# Patient Record
Sex: Female | Born: 1960 | Race: White | Hispanic: No | Marital: Married | Smoking: Never smoker
Health system: Southern US, Community
[De-identification: ages and names within clinical notes are randomized; demographics above are authoritative.]

## PROBLEM LIST (undated history)

## (undated) DIAGNOSIS — Z789 Other specified health status: Secondary | ICD-10-CM

## (undated) HISTORY — PX: TUBAL LIGATION: SHX77

---

## 2010-01-01 ENCOUNTER — Other Ambulatory Visit: Admission: RE | Admit: 2010-01-01 | Discharge: 2010-01-01 | Payer: Self-pay | Admitting: Gynecology

## 2010-01-01 ENCOUNTER — Ambulatory Visit: Payer: Self-pay | Admitting: Women's Health

## 2012-06-30 ENCOUNTER — Encounter: Payer: Self-pay | Admitting: Women's Health

## 2012-06-30 ENCOUNTER — Ambulatory Visit (INDEPENDENT_AMBULATORY_CARE_PROVIDER_SITE_OTHER): Payer: 59 | Admitting: Women's Health

## 2012-06-30 ENCOUNTER — Other Ambulatory Visit (HOSPITAL_COMMUNITY)
Admission: RE | Admit: 2012-06-30 | Discharge: 2012-06-30 | Disposition: A | Discharge status: Home / Self Care | Payer: 59 | Source: Ambulatory Visit | Attending: Women's Health | Admitting: Women's Health

## 2012-06-30 ENCOUNTER — Other Ambulatory Visit: Payer: Self-pay | Admitting: Gynecology

## 2012-06-30 VITALS — BP 118/88 | Ht 64.0 in | Wt 142.0 lb

## 2012-06-30 DIAGNOSIS — Z1231 Encounter for screening mammogram for malignant neoplasm of breast: Secondary | ICD-10-CM

## 2012-06-30 DIAGNOSIS — N920 Excessive and frequent menstruation with regular cycle: Secondary | ICD-10-CM

## 2012-06-30 DIAGNOSIS — Z01419 Encounter for gynecological examination (general) (routine) without abnormal findings: Secondary | ICD-10-CM | POA: Insufficient documentation

## 2012-06-30 DIAGNOSIS — N898 Other specified noninflammatory disorders of vagina: Secondary | ICD-10-CM

## 2012-06-30 DIAGNOSIS — Z1151 Encounter for screening for human papillomavirus (HPV): Secondary | ICD-10-CM | POA: Insufficient documentation

## 2012-06-30 DIAGNOSIS — Z13220 Encounter for screening for lipoid disorders: Secondary | ICD-10-CM

## 2012-06-30 DIAGNOSIS — Z833 Family history of diabetes mellitus: Secondary | ICD-10-CM

## 2012-06-30 LAB — CBC WITH DIFFERENTIAL/PLATELET
HCT: 38.8 % (ref 36.0–46.0)
Hemoglobin: 14 g/dL (ref 12.0–15.0)
Lymphocytes Relative: 14 % (ref 12–46)
Lymphs Abs: 1.2 10*3/uL (ref 0.7–4.0)
MCHC: 36.1 g/dL — ABNORMAL HIGH (ref 30.0–36.0)
Monocytes Absolute: 0.7 10*3/uL (ref 0.1–1.0)
Monocytes Relative: 8 % (ref 3–12)
Neutro Abs: 6.3 10*3/uL (ref 1.7–7.7)
Neutrophils Relative %: 76 % (ref 43–77)
RBC: 4.33 MIL/uL (ref 3.87–5.11)
WBC: 8.3 10*3/uL (ref 4.0–10.5)

## 2012-06-30 LAB — LIPID PANEL
Cholesterol: 160 mg/dL (ref 0–200)
HDL: 56 mg/dL (ref >39)
Total CHOL/HDL Ratio: 2.9 Ratio
Triglycerides: 108 mg/dL (ref <150)
VLDL: 22 mg/dL (ref 0–40)

## 2012-06-30 LAB — GLUCOSE, RANDOM: Glucose, Bld: 81 mg/dL (ref 70–99)

## 2012-06-30 NOTE — Progress Notes (Signed)
Katherine Velez 04/26/1961 [id number]    History:    The patient presents for annual exam.  BTL Monthly cycles, until last month had spotting for 2 weeks and then had 8 day extremely heavy cycle. Denies menopausal symptoms. History of normal Paps, last Pap 2011 in [place]. Currently attending graduate school at [employer]. Has not had a mammogram in several years. Has had a normal one in the past. Has not had a colonoscopy.   Past medical history, past surgical history, family history and social history were all reviewed and documented in the EPIC chart. Son in college in [place],  daughter lives in [place]. Originally from [place].   ROS:  A  ROS was performed and pertinent positives and negatives are included in the history.  Exam:  Filed Vitals:   06/30/12 0954  BP: 118/88    General appearance:  Normal Head/Neck:  Normal, without cervical or supraclavicular adenopathy. Thyroid:  Symmetrical, normal in size, without palpable masses or nodularity. Respiratory  Effort:  Normal  Auscultation:  Clear without wheezing or rhonchi Cardiovascular  Auscultation:  Regular rate, without rubs, murmurs or gallops  Edema/varicosities:  Not grossly evident Abdominal  Soft,nontender, without masses, guarding or rebound.  Liver/spleen:  No organomegaly noted  Hernia:  None appreciated  Skin  Inspection:  Grossly normal  Palpation:  Grossly normal Neurologic/psychiatric  Orientation:  Normal with appropriate conversation.  Mood/affect:  Normal  Genitourinary    Breasts: Examined lying and sitting.     Right: Without masses, retractions, discharge or axillary adenopathy.     Left: Without masses, retractions, discharge or axillary adenopathy.   Inguinal/mons:  Normal without inguinal adenopathy  External genitalia:  Normal  BUS/Urethra/Skene's glands:  Normal  Bladder:  Normal  Vagina:  Normal  Cervix:  Normal  Uterus:   normal in size, shape and contour.  Midline and  mobile  Adnexa/parametria:     Rt: Without masses or tenderness.   Lt: Without masses or tenderness.  Anus and perineum: Normal  Digital rectal exam: Normal sphincter tone without palpated masses or tenderness  Assessment/Plan:  51 y.o. M WF G2 P2 for annual exam.     Two-week spotting episode followed by  8 day heavy cycle in December/BTL Limited healthcare in several years  Plan: CBC, TSH, glucose, lipid panel, UA, Pap. Reviewed importance of an annual mammogram, instructed to schedule mammogram with 3 deep tomography, breasts are days. Sonohysterogram with Dr. Lily Peer, will schedule. Colonoscopy, reviewed importance of a screening. SBE's, increase regular exercise, calcium rich diet, vitamin D 2000 daily encouraged.    [facility], 11:47 AM 06/30/2012 2

## 2012-06-30 NOTE — Patient Instructions (Addendum)
Vit D 2000 daily Colonoscopy  [place], Dr [address] Mammogram yearly Health Maintenance, Females A healthy lifestyle and preventative care can promote health and wellness.  Maintain regular health, dental, and eye exams.  Eat a healthy diet. Foods like vegetables, fruits, whole grains, low-fat dairy products, and lean protein foods contain the nutrients you need without too many calories. Decrease your intake of foods high in solid fats, added sugars, and salt. Get information about a proper diet from your caregiver, if necessary.  Regular physical exercise is one of the most important things you can do for your health. Most adults should get at least 150 minutes of moderate-intensity exercise (any activity that increases your heart rate and causes you to sweat) each week. In addition, most adults need muscle-strengthening exercises on 2 or more days a week.   Maintain a healthy weight. The body mass index (BMI) is a screening tool to identify possible weight problems. It provides an estimate of body fat based on height and weight. Your caregiver can help determine your BMI, and can help you achieve or maintain a healthy weight. For adults 20 years and older:  A BMI below 18.5 is considered underweight.  A BMI of 18.5 to 24.9 is normal.  A BMI of 25 to 29.9 is considered overweight.  A BMI of 30 and above is considered obese.  Maintain normal blood lipids and cholesterol by exercising and minimizing your intake of saturated fat. Eat a balanced diet with plenty of fruits and vegetables. Blood tests for lipids and cholesterol should begin at age 44 and be repeated every 5 years. If your lipid or cholesterol levels are high, you are over 50, or you are a high risk for heart disease, you may need your cholesterol levels checked more frequently.Ongoing high lipid and cholesterol levels should be treated with medicines if diet and exercise are not effective.  If you smoke, find out from your  caregiver how to quit. If you do not use tobacco, do not start.  If you are pregnant, do not drink alcohol. If you are breastfeeding, be very cautious about drinking alcohol. If you are not pregnant and choose to drink alcohol, do not exceed 1 drink per day. One drink is considered to be 12 ounces (355 mL) of beer, 5 ounces (148 mL) of wine, or 1.5 ounces (44 mL) of liquor.  Avoid use of street drugs. Do not share needles with anyone. Ask for help if you need support or instructions about stopping the use of drugs.  High blood pressure causes heart disease and increases the risk of stroke. Blood pressure should be checked at least every 1 to 2 years. Ongoing high blood pressure should be treated with medicines, if weight loss and exercise are not effective.  If you are 69 to 51 years old, ask your caregiver if you should take aspirin to prevent strokes.  Diabetes screening involves taking a blood sample to check your fasting blood sugar level. This should be done once every 3 years, after age 21, if you are within normal weight and without risk factors for diabetes. Testing should be considered at a younger age or be carried out more frequently if you are overweight and have at least 1 risk factor for diabetes.  Breast cancer screening is essential preventative care for women. You should practice "breast self-awareness." This means understanding the normal appearance and feel of your breasts and may include breast self-examination. Any changes detected, no matter how small, should be reported  to a caregiver. Women in their 52s and 30s should have a clinical breast exam (CBE) by a caregiver as part of a regular health exam every 1 to 3 years. After age 2, women should have a CBE every year. Starting at age 36, women should consider having a mammogram (breast X-ray) every year. Women who have a family history of breast cancer should talk to their caregiver about genetic screening. Women at a high risk of  breast cancer should talk to their caregiver about having an MRI and a mammogram every year.  The Pap test is a screening test for cervical cancer. Women should have a Pap test starting at age 58. Between ages 60 and 66, Pap tests should be repeated every 2 years. Beginning at age 30, you should have a Pap test every 3 years as long as the past 3 Pap tests have been normal. If you had a hysterectomy for a problem that was not cancer or a condition that could lead to cancer, then you no longer need Pap tests. If you are between ages 56 and 76, and you have had normal Pap tests going back 10 years, you no longer need Pap tests. If you have had past treatment for cervical cancer or a condition that could lead to cancer, you need Pap tests and screening for cancer for at least 20 years after your treatment. If Pap tests have been discontinued, risk factors (such as a new sexual partner) need to be reassessed to determine if screening should be resumed. Some women have medical problems that increase the chance of getting cervical cancer. In these cases, your caregiver may recommend more frequent screening and Pap tests.  The human papillomavirus (HPV) test is an additional test that may be used for cervical cancer screening. The HPV test looks for the virus that can cause the cell changes on the cervix. The cells collected during the Pap test can be tested for HPV. The HPV test could be used to screen women aged 59 years and older, and should be used in women of any age who have unclear Pap test results. After the age of 22, women should have HPV testing at the same frequency as a Pap test.  Colorectal cancer can be detected and often prevented. Most routine colorectal cancer screening begins at the age of 25 and continues through age 33. However, your caregiver may recommend screening at an earlier age if you have risk factors for colon cancer. On a yearly basis, your caregiver may provide home test kits to check  for hidden blood in the stool. Use of a small camera at the end of a tube, to directly examine the colon (sigmoidoscopy or colonoscopy), can detect the earliest forms of colorectal cancer. Talk to your caregiver about this at age 32, when routine screening begins. Direct examination of the colon should be repeated every 5 to 10 years through age 13, unless early forms of pre-cancerous polyps or small growths are found.  Hepatitis C blood testing is recommended for all people born from [birth year] through 1965 and any individual with known risks for hepatitis C.  Practice safe sex. Use condoms and avoid high-risk sexual practices to reduce the spread of sexually transmitted infections (STIs). Sexually active women aged 35 and younger should be checked for Chlamydia, which is a common sexually transmitted infection. Older women with new or multiple partners should also be tested for Chlamydia. Testing for other STIs is recommended if you are sexually active and  at increased risk.  Osteoporosis is a disease in which the bones lose minerals and strength with aging. This can result in serious bone fractures. The risk of osteoporosis can be identified using a bone density scan. Women ages 25 and over and women at risk for fractures or osteoporosis should discuss screening with their caregivers. Ask your caregiver whether you should be taking a calcium supplement or vitamin D to reduce the rate of osteoporosis.  Menopause can be associated with physical symptoms and risks. Hormone replacement therapy is available to decrease symptoms and risks. You should talk to your caregiver about whether hormone replacement therapy is right for you.  Use sunscreen with a sun protection factor (SPF) of 30 or greater. Apply sunscreen liberally and repeatedly throughout the day. You should seek shade when your shadow is shorter than you. Protect yourself by wearing long sleeves, pants, a wide-brimmed hat, and sunglasses year round,  whenever you are outdoors.  Notify your caregiver of new moles or changes in moles, especially if there is a change in shape or color. Also notify your caregiver if a mole is larger than the size of a pencil eraser.  Stay current with your immunizations. Document Released: 01/11/2011 Document Revised: 09/20/2011 Document Reviewed: 01/11/2011 [facility] Patient Information 2013 [place], [place].

## 2012-07-01 LAB — URINALYSIS W MICROSCOPIC + REFLEX CULTURE
Bilirubin Urine: NEGATIVE
Glucose, UA: NEGATIVE mg/dL
Ketones, ur: NEGATIVE mg/dL
Protein, ur: NEGATIVE mg/dL
Urobilinogen, UA: 0.2 mg/dL (ref 0.0–1.0)

## 2012-07-03 LAB — URINE CULTURE: Colony Count: >=100000

## 2012-07-07 ENCOUNTER — Ambulatory Visit
Admission: RE | Admit: 2012-07-07 | Discharge: 2012-07-07 | Disposition: A | Discharge status: Home / Self Care | Payer: 59 | Source: Ambulatory Visit | Attending: Gynecology | Admitting: Gynecology

## 2012-07-07 DIAGNOSIS — Z1231 Encounter for screening mammogram for malignant neoplasm of breast: Secondary | ICD-10-CM

## 2012-07-10 ENCOUNTER — Telehealth: Payer: Self-pay | Admitting: *Deleted

## 2012-07-10 ENCOUNTER — Other Ambulatory Visit: Payer: Self-pay | Admitting: Women's Health

## 2012-07-10 DIAGNOSIS — N390 Urinary tract infection, site not specified: Secondary | ICD-10-CM

## 2012-07-10 MED ORDER — CIPROFLOXACIN HCL 500 MG PO TABS: 500.0000 mg | ORAL_TABLET | Freq: Two times a day (BID) | ORAL | Status: DC

## 2012-07-10 NOTE — Telephone Encounter (Signed)
Telephone call, sonohysterogram with scheduled after a one-month irregular cycle with menorrhagia, will cancel and reschedule if continued problems. She is in school in [place] and will schedule her to break if needed.

## 2012-07-10 NOTE — Telephone Encounter (Signed)
Telephone call, message left on cell phone appear

## 2012-07-10 NOTE — Telephone Encounter (Signed)
Pt had to cancel [facility] appointment with JF on 07/13/12 due to bleeding. Pt said she will be returning to school in [place], [place] on Jan 3. Pt asked how urgent is [facility]? Please advise

## 2012-07-13 ENCOUNTER — Other Ambulatory Visit: Payer: 59

## 2012-07-13 ENCOUNTER — Ambulatory Visit: Payer: 59 | Admitting: Gynecology

## 2012-11-24 ENCOUNTER — Ambulatory Visit (INDEPENDENT_AMBULATORY_CARE_PROVIDER_SITE_OTHER): Payer: 59 | Admitting: Women's Health

## 2012-11-24 ENCOUNTER — Ambulatory Visit (INDEPENDENT_AMBULATORY_CARE_PROVIDER_SITE_OTHER): Payer: 59

## 2012-11-24 ENCOUNTER — Encounter: Payer: Self-pay | Admitting: Women's Health

## 2012-11-24 DIAGNOSIS — R109 Unspecified abdominal pain: Secondary | ICD-10-CM

## 2012-11-24 DIAGNOSIS — D219 Benign neoplasm of connective and other soft tissue, unspecified: Secondary | ICD-10-CM

## 2012-11-24 DIAGNOSIS — D259 Leiomyoma of uterus, unspecified: Secondary | ICD-10-CM

## 2012-11-24 DIAGNOSIS — M545 Low back pain, unspecified: Secondary | ICD-10-CM

## 2012-11-24 LAB — WET PREP FOR TRICH, YEAST, CLUE
Clue Cells Wet Prep HPF POC: NONE SEEN
Trich, Wet Prep: NONE SEEN

## 2012-11-24 LAB — URINALYSIS W MICROSCOPIC + REFLEX CULTURE
Bilirubin Urine: NEGATIVE
Glucose, UA: NEGATIVE mg/dL
Hgb urine dipstick: NEGATIVE
Ketones, ur: NEGATIVE mg/dL
Nitrite: NEGATIVE
Specific Gravity, Urine: 1.015 (ref 1.005–1.030)
pH: 7 (ref 5.0–8.0)

## 2012-11-24 NOTE — Progress Notes (Addendum)
Patient ID: Katherine Velez, female   DOB: Jul 18, 1960, 52 y.o.   MRN: [id number] Presents with questionable UTI. States has low back ache, some low pelvic discomfort with bloating. Continues to have a monthly cycle /BTL. Denies fever, nausea or vomiting. 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  Exam: Appears well, UA: Negative. External genitalia within normal limits, speculum exam scant discharge with no visible erythema or discharge or odor. Wet prep negative. Bimanual no CMT or adnexal fullness on right,  Fullness on left, states has slight pressure sensation on left side. Ultrasound: 66 x 56 x 63 mm left pedunculated fibroid, 24 x 50 x 23 mm intramural fibroid,  ovaries appear normal no free fluid. Endometrium 4.4.  Fibroid uterus  Plan: Reviewed options, is 51 about to be 52, menopause reviewed, if continued discomfort hysterectomy reviewed. Declines at this time. Reviewed ovaries appear normal. States has numerous family members with fibroids.

## 2012-11-24 NOTE — Patient Instructions (Addendum)
Uterine Fibroid  A uterine fibroid is a growth (tumor) that occurs in a woman's uterus. This type of tumor is not cancerous and does not spread out of the uterus. A woman can have one or many fibroids, and the fiboid(s) can become quite large. A fibroid can vary in size, weight, and where it grows in the uterus. Most fibroids do not require medical treatment, but some can cause pain or heavy bleeding during and between periods.  CAUSES   A fibroid is the result of a single uterine cell that keeps growing (unregulated), which is different than most cells in the human body. Most cells have a control mechanism that keeps them from reproducing without control.   SYMPTOMS    Bleeding.   Pelvic pain and pressure.   Bladder problems due to the size of the fibroid.   Infertility and miscarriages depending on the size and location of the fibroid.  DIAGNOSIS   A diagnosis is made by physical exam. Your caregiver may feel the lumpy tumors during a pelvic exam. Important information regarding size, location, and number of tumors can be gained by having an ultrasound. It is rare that other tests, such as a CT scan or MRI, are needed.  TREATMENT    Your caregiver may recommend watchful waiting. This involves getting the fibroid checked by your caregiver to see if the fibroids grow or shrink.    Hormonal treatment or an intrauterine device (IUD) may be prescribed.    Surgery may be needed to remove the fibroids (myomectomy) or the uterus (hysterectomy). This depends on your situation.  When fibroids interfere with fertility and a woman wants to become pregnant, a caregiver may recommend having the fibroids removed.   HOME CARE INSTRUCTIONS   Home care depends on how you were treated. In general:    Keep all follow-up appointments with your caregiver.    Only take medicine as told by your caregiver. Do not take aspirin. It can cause bleeding.    If you have excessive periods and soak tampons or pads in a half hour or  less, contact your caregiver immediately. If your periods are troublesome but not so heavy, lie down with your feet raised slightly above your heart. Place cold packs on your lower abdomen.    If your periods are heavy, write down the number of pads or tampons you use per month. Bring this information to your caregiver.    Talk to your caregiver about taking iron pills.    Include green vegetables in your diet.    If you were prescribed a hormonal treatment, take the hormonal medicines as directed.    If you need surgery, ask your caregiver for information on your specific surgery.   SEEK IMMEDIATE MEDICAL CARE IF:   You have pelvic pain or cramps not controlled with medicines.    You have a sudden increase in pelvic pain.    You have an increase of bleeding between and during periods.    You feel lightheaded or have fainting episodes.   MAKE SURE YOU:   Understand these instructions.   Will watch your condition.   Will get help right away if you are not doing well or get worse.  Document Released: 06/25/2000 Document Revised: 09/20/2011 Document Reviewed: 07/19/2011  [employer] Patient Information 2013 [employer].

## 2012-11-26 LAB — URINE CULTURE

## 2012-12-29 ENCOUNTER — Telehealth: Payer: Self-pay | Admitting: *Deleted

## 2012-12-29 NOTE — Telephone Encounter (Signed)
Pt called requesting if a letter for school could be written she has been having some problems with her fibroids as noted on 11/24/12 note. And missing some days at school. If any question pt said you may call her at [phone].

## 2012-12-29 NOTE — Telephone Encounter (Signed)
Number given not reachable.

## 2013-01-01 ENCOUNTER — Encounter: Payer: Self-pay | Admitting: Women's Health

## 2013-01-01 NOTE — Telephone Encounter (Signed)
Sorry correct # [phone]

## 2013-01-01 NOTE — Telephone Encounter (Signed)
Telephone call, letter written to Department share at [employer] concerning problem with fibroids.

## 2013-04-10 ENCOUNTER — Encounter: Payer: Self-pay | Admitting: Women's Health

## 2013-04-10 ENCOUNTER — Ambulatory Visit (INDEPENDENT_AMBULATORY_CARE_PROVIDER_SITE_OTHER): Payer: BC Managed Care – PPO | Admitting: Women's Health

## 2013-04-10 DIAGNOSIS — B9689 Other specified bacterial agents as the cause of diseases classified elsewhere: Secondary | ICD-10-CM

## 2013-04-10 DIAGNOSIS — A499 Bacterial infection, unspecified: Secondary | ICD-10-CM

## 2013-04-10 DIAGNOSIS — N898 Other specified noninflammatory disorders of vagina: Secondary | ICD-10-CM

## 2013-04-10 DIAGNOSIS — M549 Dorsalgia, unspecified: Secondary | ICD-10-CM

## 2013-04-10 DIAGNOSIS — N760 Acute vaginitis: Secondary | ICD-10-CM

## 2013-04-10 LAB — URINALYSIS W MICROSCOPIC + REFLEX CULTURE
Bilirubin Urine: NEGATIVE
Crystals: NONE SEEN
Glucose, UA: NEGATIVE mg/dL
Ketones, ur: NEGATIVE mg/dL
Protein, ur: NEGATIVE mg/dL
RBC / HPF: NONE SEEN RBC/hpf (ref <3)
Urobilinogen, UA: 0.2 mg/dL (ref 0.0–1.0)
WBC, UA: NONE SEEN WBC/hpf (ref <3)

## 2013-04-10 MED ORDER — METRONIDAZOLE 0.75 % VA GEL: VAGINAL | Status: DC

## 2013-04-10 MED ORDER — FLUCONAZOLE 150 MG PO TABS: 150.0000 mg | ORAL_TABLET | Freq: Once | ORAL | Status: DC

## 2013-04-10 NOTE — Progress Notes (Signed)
Patient ID: Caleen Jobs, female   DOB: 05-Mar-1961, 52 y.o.   MRN: [id number] Presents with complaint of bilateral back pain, low abdominal pressure and generally not feeling well abdominally. States something is wrong and relates to possibly fibroids enlarging. Denies vaginal discharge, urinary symptoms, constipation, fever. Monthly cycles/BTL without menopausal symptoms. Fibroids noted on ultrasound May 2014 fibroids: 66 x 56 x 63 mm second fibroid  24 x 15 x 23 mm. Recently completed graduate program at [employer], some question concerning degree being given.   Exam: Appears well but stressed. Abdomen soft no rebound or radiation of pain, external genitalia within normal limits, speculum exam moderate amount of  white discharge noted with no odor. Wet prep positive for few yeast, few clue cells, TNTC bacteria. Bimanual uterus enlarged nontender no adnexal fullness or tenderness with exam.  Fibroid uterus Pelvic pressure Bacteria vaginosis and yeast  Plan: Diflucan 150 by mouth x1 dose, MetroGel vaginal cream 1 applicator at bedtime x5. Alcohol precautions reviewed. Schedule ultrasound to assess fibroids. Counseling encouraged/ graduation completion issues, Consuelo Pandy name and number was given.

## 2013-04-10 NOTE — Patient Instructions (Addendum)
Monilial Vaginitis  Vaginitis in a soreness, swelling and redness (inflammation) of the vagina and vulva. Monilial vaginitis is not a sexually transmitted infection.  CAUSES   Yeast vaginitis is caused by yeast (candida) that is normally found in your vagina. With a yeast infection, the candida has overgrown in number to a point that upsets the chemical balance.  SYMPTOMS   · White, thick vaginal discharge.  · Swelling, itching, redness and irritation of the vagina and possibly the lips of the vagina (vulva).  · Burning or painful urination.  · Painful intercourse.  DIAGNOSIS   Things that may contribute to monilial vaginitis are:  · Postmenopausal and virginal states.  · Pregnancy.  · Infections.  · Being tired, sick or stressed, especially if you had monilial vaginitis in the past.  · Diabetes. Good control will help lower the chance.  · Birth control pills.  · Tight fitting garments.  · Using bubble bath, feminine sprays, douches or deodorant tampons.  · Taking certain medications that kill germs (antibiotics).  · Sporadic recurrence can occur if you become ill.  TREATMENT   Your caregiver will give you medication.  · There are several kinds of anti monilial vaginal creams and suppositories specific for monilial vaginitis. For recurrent yeast infections, use a suppository or cream in the vagina 2 times a week, or as directed.  · Anti-monilial or steroid cream for the itching or irritation of the vulva may also be used. Get your caregiver's permission.  · Painting the vagina with methylene blue solution may help if the monilial cream does not work.  · Eating yogurt may help prevent monilial vaginitis.  HOME CARE INSTRUCTIONS   · Finish all medication as prescribed.  · Do not have sex until treatment is completed or after your caregiver tells you it is okay.  · Take warm sitz baths.  · Do not douche.  · Do not use tampons, especially scented ones.  · Wear cotton underwear.  · Avoid tight pants and panty  hose.  · Tell your sexual partner that you have a yeast infection. They should go to their caregiver if they have symptoms such as mild rash or itching.  · Your sexual partner should be treated as well if your infection is difficult to eliminate.  · Practice safer sex. Use condoms.  · Some vaginal medications cause latex condoms to fail. Vaginal medications that harm condoms are:  · Cleocin cream.  · Butoconazole (Femstat®).  · Terconazole (Terazol®) vaginal suppository.  · Miconazole (Monistat®) (may be purchased over the counter).  SEEK MEDICAL CARE IF:   · You have a temperature by mouth above 102° F (38.9° C).  · The infection is getting worse after 2 days of treatment.  · The infection is not getting better after 3 days of treatment.  · You develop blisters in or around your vagina.  · You develop vaginal bleeding, and it is not your menstrual period.  · You have pain when you urinate.  · You develop intestinal problems.  · You have pain with sexual intercourse.  Document Released: 04/07/2005 Document Revised: 09/20/2011 Document Reviewed: 12/20/2008  ExitCare® Patient Information ©2014 [employer].

## 2013-04-19 ENCOUNTER — Ambulatory Visit (INDEPENDENT_AMBULATORY_CARE_PROVIDER_SITE_OTHER): Payer: BC Managed Care – PPO | Admitting: Women's Health

## 2013-04-19 ENCOUNTER — Encounter: Payer: Self-pay | Admitting: Women's Health

## 2013-04-19 ENCOUNTER — Ambulatory Visit (INDEPENDENT_AMBULATORY_CARE_PROVIDER_SITE_OTHER): Payer: BC Managed Care – PPO

## 2013-04-19 ENCOUNTER — Other Ambulatory Visit: Payer: Self-pay | Admitting: Women's Health

## 2013-04-19 DIAGNOSIS — D251 Intramural leiomyoma of uterus: Secondary | ICD-10-CM

## 2013-04-19 DIAGNOSIS — D252 Subserosal leiomyoma of uterus: Secondary | ICD-10-CM

## 2013-04-19 DIAGNOSIS — D259 Leiomyoma of uterus, unspecified: Secondary | ICD-10-CM

## 2013-04-19 DIAGNOSIS — M549 Dorsalgia, unspecified: Secondary | ICD-10-CM

## 2013-04-19 DIAGNOSIS — N852 Hypertrophy of uterus: Secondary | ICD-10-CM

## 2013-04-19 DIAGNOSIS — N920 Excessive and frequent menstruation with regular cycle: Secondary | ICD-10-CM

## 2013-04-19 DIAGNOSIS — N8300 Follicular cyst of ovary, unspecified side: Secondary | ICD-10-CM

## 2013-04-19 NOTE — Progress Notes (Signed)
Patient ID: Caleen Jobs, female   DOB: 03/08/1961, 52 y.o.   MRN: [id number] Presents for ultrasound. Has had problems with bloating, abdominal pressure and generally not feeling well abdominally. History of fibroids. Monthly cycle/BTL. History of one cesarean delivery, one vaginal delivery. No family history of ovarian cancer. Has 5 sisters, one sister lung cancer living, one sister lymphoma living.  Ultrasound: Enlarged heterogeneous uterus anteverted with prominent endometrium avascular. Intramural fibroid 28 x 18 mm, left subserous fibroid 83 x 61 x 72 mm  72 mm mean with vascular Doppler flow. Increase size from 11/2012  62 mm mean. Right and left ovary normal. Minimal fluid in cul-de-sac.  Symptomatic large fibroids  Plan: Options reviewed, Lupron, oral contraceptives, hysterectomy. Would like to proceed with hysterectomy, [facility] handout given and reviewed risks of infection, hemorrhage, perforation. Will schedule surgical consult with Dr. Lily Peer to discuss.

## 2013-04-24 ENCOUNTER — Encounter: Payer: Self-pay | Admitting: Internal Medicine

## 2013-04-24 ENCOUNTER — Ambulatory Visit (INDEPENDENT_AMBULATORY_CARE_PROVIDER_SITE_OTHER): Payer: BC Managed Care – PPO | Admitting: Internal Medicine

## 2013-04-24 ENCOUNTER — Ambulatory Visit: Payer: BC Managed Care – PPO

## 2013-04-24 VITALS — BP 106/62 | HR 59 | Temp 98.5°F | Resp 12 | Ht 64.0 in | Wt 146.0 lb

## 2013-04-24 DIAGNOSIS — M549 Dorsalgia, unspecified: Secondary | ICD-10-CM

## 2013-04-24 DIAGNOSIS — N920 Excessive and frequent menstruation with regular cycle: Secondary | ICD-10-CM

## 2013-04-24 DIAGNOSIS — M546 Pain in thoracic spine: Secondary | ICD-10-CM

## 2013-04-24 LAB — POCT CBC
Granulocyte percent: 72 %G (ref 37–80)
Lymph, poc: 1.3 (ref 0.6–3.4)
MCHC: 32.4 g/dL (ref 31.8–35.4)
MPV: 9.2 fL (ref 0–99.8)
POC Granulocyte: 4.8 (ref 2–6.9)
POC LYMPH PERCENT: 19.2 %L (ref 10–50)
POC MID %: 8.8 %M (ref 0–12)
Platelet Count, POC: 180 10*3/uL (ref 142–424)
RDW, POC: 12.4 %

## 2013-04-24 LAB — COMPREHENSIVE METABOLIC PANEL
Albumin: 4.4 g/dL (ref 3.5–5.2)
Alkaline Phosphatase: 52 U/L (ref 39–117)
BUN: 13 mg/dL (ref 6–23)
Creat: 0.68 mg/dL (ref 0.50–1.10)
Glucose, Bld: 83 mg/dL (ref 70–99)
Total Bilirubin: 0.5 mg/dL (ref 0.3–1.2)

## 2013-04-24 LAB — LIPID PANEL
HDL: 45 mg/dL (ref >39)
LDL Cholesterol: 87 mg/dL (ref 0–99)
Total CHOL/HDL Ratio: 3.7 Ratio
Triglycerides: 168 mg/dL — ABNORMAL HIGH (ref <150)
VLDL: 34 mg/dL (ref 0–40)

## 2013-04-24 LAB — POCT SEDIMENTATION RATE: POCT SED RATE: 3 mm/hr (ref 0–22)

## 2013-04-24 LAB — TSH: TSH: 1.554 u[IU]/mL (ref 0.350–4.500)

## 2013-04-24 NOTE — Progress Notes (Signed)
This chart was scribed for Katherine Sia, MD by [name], ED Scribe. This patient was seen in room Room 3 and the patient's care was started at 11:51 AM   Subjective:    Patient ID: Katherine Velez, female    DOB: 1960-09-01, 52 y.o.   MRN: [id number]  HPI Katherine Velez is a 52 y.o. female who presents to the [facility] complaining of ongoing, worsening right sided lower back pain onset December 2013. 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 Pt states the tumors have grown 3cm since May. Pt is scheduled for a hysterectomy November 2014. Pt has family history of Lymphoma and Fibroid Tumors. Pt denies pain affecting her sleep. Pt states she has pain with deep breaths. Pt denies pain that radiated to her right leg. Pt states she has been feeling "tingling sensations" for a while. Pt denies cough, fever, chills, weight loss, dysuria, frequency of urinating, and other associated symptoms. Pt states she drinks a large amount of water. She states she walks regularly.  Pt states she has not had blood work done. She states she has lost an excessive blood loss during her menstrual cycles. Pt denies PCP but states [name] is her OB/GYN.  Family History  Problem Relation Age of Onset  . Hyperlipidemia Mother   . Hypertension Mother   . Diabetes Father   . Cancer Sister     lung  . Cancer Maternal Aunt     gallbladder  . Hyperlipidemia Maternal Aunt   . Hyperlipidemia Maternal Uncle   . Heart disease Maternal Uncle   . Heart failure Maternal Grandfather     MI  . Hyperlipidemia Maternal Grandfather   . Cancer Paternal Grandfather     colon  . Cancer Sister     lymphoma  . Hyperlipidemia Maternal Uncle   . Heart disease Maternal Uncle    Patient Active Problem List   Diagnosis Date Noted  . Fibroids 11/24/2012    Review of Systems  Constitutional: Negative for fever, chills, diaphoresis, activity change, appetite change and unexpected weight change.   Respiratory: Negative for cough, chest tightness and shortness of breath.   Cardiovascular: Negative for chest pain and palpitations.  Gastrointestinal: Negative for diarrhea.  Genitourinary: Negative for dysuria and frequency.  Musculoskeletal: Positive for back pain (lower R sided). Negative for gait problem and neck pain.  Skin: Negative for rash.  Neurological: Negative for weakness and headaches.  Psychiatric/Behavioral: Negative for sleep disturbance.       Objective:   Physical Exam  Nursing note and vitals reviewed. Constitutional: She is oriented to person, place, and time. She appears well-developed and well-nourished. No distress.  HENT:  Head: Normocephalic and atraumatic.  Mouth/Throat: Oropharynx is clear and moist.  Eyes: Conjunctivae and EOM are normal. Pupils are equal, round, and reactive to light.  Neck: Neck supple. No thyromegaly present.  Cardiovascular: Normal rate, regular rhythm and normal heart sounds.   No murmur heard. Pulmonary/Chest: Effort normal and breath sounds normal. No respiratory distress.  Tender to palpation along lower right posterior ribs.  Abdominal:  No RUQ tenderness. No flank tenderness to percussion.   Musculoskeletal: Normal range of motion. She exhibits no edema.  Two very small deep lumps in right lumbar sacral area that are slightly tender. No Paraspinous or bony tenderness. Straight leg raise negative. Twist non-tender. Full reach tender, R flank.  Lymphadenopathy:    She has no cervical adenopathy.  Neurological: She is alert and oriented to person, place,  and time. She has normal reflexes.  Skin: Skin is warm and dry. No rash noted.  Psychiatric: She has a normal mood and affect. Her behavior is normal.        Results for orders placed in visit on 04/24/13  POCT CBC      Result Value Range   WBC 6.7  4.6 - 10.2 K/uL   Lymph, poc 1.3  0.6 - 3.4   POC LYMPH PERCENT 19.2  10 - 50 %L   MID (cbc) 0.6  0 - 0.9   POC MID %  8.8  0 - 12 %M   POC Granulocyte 4.8  2 - 6.9   Granulocyte percent 72.0  37 - 80 %G   RBC 4.49  4.04 - 5.48 M/uL   Hemoglobin 14.4  12.2 - 16.2 g/dL   HCT, POC 16.1  09.6 - 47.9 %   MCV 99.2 (*) 80 - 97 fL   MCH, POC 32.1 (*) 27 - 31.2 pg   MCHC 32.4  31.8 - 35.4 g/dL   RDW, POC 04.5     Platelet Count, POC 180  142 - 424 K/uL   MPV 9.2  0 - 99.8 fL   [facility] reading (PRIMARY) by  Dr. Merla Riches Chest X-Ray normal including no lytic lesions/// and LS spine within normal limits.   12:46 PM- Discussed labs and radiology findings with pt. Pt is not anemic.   Assessment & Plan:  Back pain-lumbar  Menorrhagia  Thoracic back pain Concern for underlying malignancy  Will contact w/ labs   I have completed the patient encounter in its entirety as documented by the scribe, with editing by me where necessary. Beyounce Dickens P. Merla Riches, M.D.

## 2013-05-01 ENCOUNTER — Telehealth: Payer: Self-pay

## 2013-05-01 ENCOUNTER — Encounter (HOSPITAL_COMMUNITY): Payer: Self-pay | Admitting: Pharmacist

## 2013-05-01 NOTE — Telephone Encounter (Signed)
Spoke to patient she is looking for CA124 she came in for this specifically to be done. She is also looking for thyroid labs. I have advised her of the TSH results. Please advise.

## 2013-05-01 NOTE — Telephone Encounter (Signed)
PT RECEIVED A COPY OF HER TEST RESULTS AND WERE TOLD IF SHE HAD QUESTIONS TO GIVE [place] A CALL , SHE HAVE SOME QUESTIONS FOR DR Jacquiline Doe CALL [phone]

## 2013-05-03 ENCOUNTER — Institutional Professional Consult (permissible substitution): Payer: BC Managed Care – PPO | Admitting: Gynecology

## 2013-05-03 NOTE — Telephone Encounter (Signed)
Thank you I have called patient to advise. Left message for her to call me back  

## 2013-05-03 NOTE — Telephone Encounter (Signed)
If she wants to take the chance that insurance won't cover then I would be happy to send her an order to either [facility] or [facility] to go to the op lab and have it drawn

## 2013-05-03 NOTE — Telephone Encounter (Signed)
Thank you. She states she came here specifically to have this done and no one wants to order these for her. If she pays for them, where can she have them done?

## 2013-05-03 NOTE — Telephone Encounter (Signed)
Ca 125 testing is not recommended for screening for GYN malignancies so we did not do the test This test is only indicated for use as a serial marker in high risk women coupled with ultrasound and done by OB-GYN  She has Katherine Velez young and Dr Lily Peer caring for her with hysterectomy planned and she should discuss with them whether this test is indicated or not

## 2013-05-04 ENCOUNTER — Encounter: Payer: Self-pay | Admitting: Gynecology

## 2013-05-04 ENCOUNTER — Ambulatory Visit (INDEPENDENT_AMBULATORY_CARE_PROVIDER_SITE_OTHER): Payer: BC Managed Care – PPO | Admitting: Gynecology

## 2013-05-04 VITALS — BP 116/72

## 2013-05-04 DIAGNOSIS — N949 Unspecified condition associated with female genital organs and menstrual cycle: Secondary | ICD-10-CM

## 2013-05-04 DIAGNOSIS — R102 Pelvic and perineal pain: Secondary | ICD-10-CM

## 2013-05-04 DIAGNOSIS — N951 Menopausal and female climacteric states: Secondary | ICD-10-CM

## 2013-05-04 DIAGNOSIS — D259 Leiomyoma of uterus, unspecified: Secondary | ICD-10-CM

## 2013-05-04 DIAGNOSIS — R141 Gas pain: Secondary | ICD-10-CM

## 2013-05-04 DIAGNOSIS — Z01818 Encounter for other preprocedural examination: Secondary | ICD-10-CM

## 2013-05-04 DIAGNOSIS — R110 Nausea: Secondary | ICD-10-CM

## 2013-05-04 DIAGNOSIS — R140 Abdominal distension (gaseous): Secondary | ICD-10-CM

## 2013-05-04 MED ORDER — OXYCODONE-ACETAMINOPHEN 7.5-325 MG PO TABS: 1.0000 | ORAL_TABLET | ORAL | Status: DC | PRN

## 2013-05-04 MED ORDER — METOCLOPRAMIDE HCL 10 MG PO TABS: 10.0000 mg | ORAL_TABLET | Freq: Three times a day (TID) | ORAL | Status: DC

## 2013-05-04 NOTE — Patient Instructions (Addendum)
Uterine Fibroid A uterine fibroid is a growth (tumor) that occurs in a woman's uterus. This type of tumor is not cancerous and does not spread out of the uterus. A woman can have one or many fibroids, and the fiboid(s) can become quite large. A fibroid can vary in size, weight, and where it grows in the uterus. Most fibroids do not require medical treatment, but some can cause pain or heavy bleeding during and between periods. CAUSES  A fibroid is the result of a single uterine cell that keeps growing (unregulated), which is different than most cells in the human body. Most cells have a control mechanism that keeps them from reproducing without control.  SYMPTOMS   Bleeding.  Pelvic pain and pressure.  Bladder problems due to the size of the fibroid.  Infertility and miscarriages depending on the size and location of the fibroid. DIAGNOSIS  A diagnosis is made by physical exam. Your caregiver may feel the lumpy tumors during a pelvic exam. Important information regarding size, location, and number of tumors can be gained by having an ultrasound. It is rare that other tests, such as a CT scan or MRI, are needed. TREATMENT   Your caregiver may recommend watchful waiting. This involves getting the fibroid checked by your caregiver to see if the fibroids grow or shrink.   Hormonal treatment or an intrauterine device (IUD) may be prescribed.   Surgery may be needed to remove the fibroids (myomectomy) or the uterus (hysterectomy). This depends on your situation. When fibroids interfere with fertility and a woman wants to become pregnant, a caregiver may recommend having the fibroids removed.  HOME CARE INSTRUCTIONS  Home care depends on how you were treated. In general:   Keep all follow-up appointments with your caregiver.   Only take medicine as told by your caregiver. Do not take aspirin. It can cause bleeding.   If you have excessive periods and soak tampons or pads in a half hour or  less, contact your caregiver immediately. If your periods are troublesome but not so heavy, lie down with your feet raised slightly above your heart. Place cold packs on your lower abdomen.   If your periods are heavy, write down the number of pads or tampons you use per month. Bring this information to your caregiver.   Talk to your caregiver about taking iron pills.   Include green vegetables in your diet.   If you were prescribed a hormonal treatment, take the hormonal medicines as directed.   If you need surgery, ask your caregiver for information on your specific surgery.  SEEK IMMEDIATE MEDICAL CARE IF:  You have pelvic pain or cramps not controlled with medicines.   You have a sudden increase in pelvic pain.   You have an increase of bleeding between and during periods.   You feel lightheaded or have fainting episodes.  MAKE SURE YOU:  Understand these instructions.  Will watch your condition.  Will get help right away if you are not doing well or get worse. Document Released: 06/25/2000 Document Revised: 09/20/2011 Document Reviewed: 07/19/2011 [facility] Patient Information 2014 [place], [place]. Total Laparoscopic Hysterectomy A total laparoscopic hysterectomy is a minimally invasive surgery to remove your uterus and cervix. This surgery is performed by making several small cuts (incisions) in your abdomen. It can also be done with a thin, lighted tube (laparoscope) inserted into 2 small incisions in the lower abdomen. Your fallopian tubes and ovaries can be removed (bilateral salpingo-oopherectomy) during this surgery as well.If  a total laparoscopic hysterectomy is started and it is not safe to continue, the laparoscopic surgery will be converted to an open abdominal surgery. You will not have menstrual periods or be able to get pregnant after having this surgery. If a bilateral salpingo-oopherectomy was performed before menopause, you will go through a sudden  (abrupt) menopause. This can be helped with hormone medicines. Benefits of minimally invasive surgery include:  Less pain.  Less risk of blood loss.  Less risk of infection.  Quicker return to normal activities.  Usually a 1 night stay in the hospital.  Overall patient satisfaction. LET YOUR CAREGIVER KNOW ABOUT:  Any history of abnormal Pap tests.  Allergies to food or medicine.  Medicines taken, including vitamins, herbs, eyedrops, over-the-counter medicines, and creams.  Use of steroids (by mouth or creams).  Previous problems with anesthetics or numbing medicines.  History of bleeding problems or blood clots.  Previous surgery.  Other health problems, including diabetes and kidney problems.  Desire for future fertility.  Any infections or colds you may have developed.  Symptoms of irregular or heavy periods, weight loss, or urinary or bowel changes. RISKS AND COMPLICATIONS   Bleeding.  Blood clots in the legs or lung.  Infection.  Injury to surrounding organs.  Problems with anesthesia.  Early menopause symptoms (hot flashes, night sweats, insomnia).  Risk of conversion to an open abdominal incision. BEFORE THE PROCEDURE  Ask your caregiver about changing or stopping your regular medicines.  Do not take aspirin or blood thinners (anticoagulants) for 1 week before the surgery, or as told by your caregiver.  Do not eat or drink anything for 8 hours before the surgery, or as told by your caregiver.  Quit smoking if you smoke.  Arrange for a ride home after surgery and for someone to help you at home during recovery. PROCEDURE   You will be given antibiotic medicine.  An intravenous (IV) line will be placed in your arm. You will be given medicine to make you sleep (general anesthetic).  A gas (carbon dioxide) will be used to inflate your abdomen. This will allow your surgeon to look inside your abdomen, perform your surgery, and treat any other  problems found if necessary.  Three or four small incisions (often less than  inch) will be made in your abdomen. One of these incisions will be made in the area of your belly button (navel). The laparoscope will be inserted into the incision. Your surgeon will look through the laparoscope while doing your procedure.  Other surgical instruments will be inserted through the other incisions.  The uterus may be removed through the vagina or cut into small pieces and removed through the small incisions.  Your incisions will be closed. AFTER THE PROCEDURE  The gas will be released from inside your abdomen.  You will be taken to the recovery area where a nurse will watch and check your progress. Once you are awake, stable, and taking fluids well, without other problems, you will return to your room or be allowed to go home.  There is usually minimal discomfort following the surgery because the incisions are so small.  You will be given pain medicine while you are in the hospital and for when you go home.  Try to have someone with you the first 3 to 5 days after you go home.  Follow up with your surgeon in 2 to 4 weeks after surgery to evaluate your progress. Document Released: 04/25/2007 Document Revised: 09/20/2011 Document  Reviewed: 02/12/2011 [employer] Patient Information 2014 [place], [place]. Perimenopause Perimenopause is the time when your body begins to move into the menopause (no menstrual period for 12 straight months). It is a natural process. Perimenopause can begin 2 to 8 years before the menopause and usually lasts for one year after the menopause. During this time, your ovaries may or may not produce an egg. The ovaries vary in their production of estrogen and progesterone hormones each month. This can cause irregular menstrual periods, difficulty in getting pregnant, vaginal bleeding between periods and uncomfortable symptoms. CAUSES  Irregular production of the ovarian  hormones, estrogen and progesterone, and not ovulating every month.  Other causes include:  Tumor of the pituitary gland in the brain.  Medical disease that affects the ovaries.  Radiation treatment.  Chemotherapy.  Unknown causes.  Heavy smoking and excessive alcohol intake can bring on perimenopause sooner. SYMPTOMS   Hot flashes.  Night sweats.  Irregular menstrual periods.  Decrease sex drive.  Vaginal dryness.  Headaches.  Mood swings.  Depression.  Memory problems.  Irritability.  Tiredness.  Weight gain.  Trouble getting pregnant.  The beginning of losing bone cells (osteoporosis).  The beginning of hardening of the arteries (atherosclerosis). DIAGNOSIS  Your caregiver will make a diagnosis by analyzing your age, menstrual history and your symptoms. They will do a physical exam noting any changes in your body, especially your female organs. Female hormone tests may or may not be helpful depending on the amount and when you produce the female hormones. However, other hormone tests may be helpful (ex. thyroid hormone) to rule out other problems. TREATMENT  The decision to treat during the perimenopause should be made by you and your caregiver depending on how the symptoms are affecting you and your life style. There are various treatments available such as:  Treating individual symptoms with a specific medication for that symptom (ex. tranquilizer for depression).  Herbal medications that can help specific symptoms.  Counseling.  Group therapy.  No treatment. HOME CARE INSTRUCTIONS   Before seeing your caregiver, make a list of your menstrual periods (when the occur, how heavy they are, how long between periods and how long they last), your symptoms and when they started.  Take the medication as recommended by your caregiver.  Sleep and rest.  Exercise.  Eat a diet that contains calcium (good for your bones) and soy (acts like estrogen  hormone).  Do not smoke.  Avoid alcoholic beverages.  Taking vitamin E may help in certain cases.  Take calcium and vitamin D supplements to help prevent bone loss.  Group therapy is sometimes helpful.  Acupuncture may help in some cases. SEEK MEDICAL CARE IF:   You have any of the above and want to know if it is perimenopause.  You want advice and treatment for any of your symptoms mentioned above.  You need a referral to a specialist (gynecologist, psychiatrist or psychologist). SEEK IMMEDIATE MEDICAL CARE IF:   You have vaginal bleeding.  Your period lasts longer than 8 days.  You periods are recurring sooner than 21 days.  You have bleeding after intercourse.  You have severe depression.  You have pain when you urinate.  You have severe headaches.  You develop vision problems. Document Released: 08/05/2004 Document Revised: 09/20/2011 Document Reviewed: 04/25/2008 [facility] Patient Information 2014 [place], [place].  Hormone Therapy At menopause, your body begins making less estrogen and progesterone hormones. This causes the body to stop having menstrual periods. This is because estrogen  and progesterone hormones control your periods and menstrual cycle. A lack of estrogen may cause symptoms such as:  Hot flushes (or hot flashes).  Vaginal dryness.  Dry skin.  Loss of sex drive.  Risk of bone loss (osteoporosis). When this happens, you may choose to take hormone therapy to get back the estrogen lost during menopause. When the hormone estrogen is given alone, it is usually referred to as ET (Estrogen Therapy). When the hormone progestin is combined with estrogen, it is generally called HT (Hormone Therapy). This was formerly known as hormone replacement therapy (HRT). Your caregiver can help you make a decision on what will be best for you. The decision to use HT seems to change often as new studies are done. Many studies do not agree on the benefits of hormone  replacement therapy. LIKELY BENEFITS OF HT INCLUDE PROTECTION FROM:  Hot Flushes (also called hot flashes) - A hot flush is a sudden feeling of heat that spreads over the face and body. The skin may redden like a blush. It is connected with sweats and sleep disturbance. Women going through menopause may have hot flushes a few times a month or several times per day depending on the woman.  Osteoporosis (bone loss)- Estrogen helps guard against bone loss. After menopause, a woman's bones slowly lose calcium and become weak and brittle. As a result, bones are more likely to break. The hip, wrist, and spine are affected most often. Hormone therapy can help slow bone loss after menopause. Weight bearing exercise and taking calcium with vitamin D also can help prevent bone loss. There are also medications that your caregiver can prescribe that can help prevent osteoporosis.  Vaginal Dryness - Loss of estrogen causes changes in the vagina. Its lining may become thin and dry. These changes can cause pain and bleeding during sexual intercourse. Dryness can also lead to infections. This can cause burning and itching. (Vaginal estrogen treatment can help relieve pain, itching, and dryness.)  Urinary Tract Infections are more common after menopause because of lack of estrogen. Some women also develop urinary incontinence because of low estrogen levels in the vagina and bladder.  Possible other benefits of estrogen include a positive effect on mood and short-term memory in women. RISKS AND COMPLICATIONS  Using estrogen alone without progesterone causes the lining of the uterus to grow. This increases the risk of lining of the uterus (endometrial) cancer. Your caregiver should give another hormone called progestin if you have a uterus.  Women who take combined (estrogen and progestin) HT appear to have an increased risk of breast cancer. The risk appears to be small, but increases throughout the time that HT is  taken.  Combined therapy also makes the breast tissue slightly denser which makes it harder to read mammograms (breast X-rays).  Combined, estrogen and progesterone therapy can be taken together every day, in which case there may be spotting of blood. HT therapy can be taken cyclically in which case you will have menstrual periods. Cyclically means HT is taken for a set amount of days, then not taken, then this process is repeated.  HT may increase the risk of stroke, heart attack, breast cancer and forming blood clots in your leg.  Transdermal estrogen (estrogen that is absorbed through the skin with a patch or a cream) may have more positive results with:  Cholesterol.  Blood pressure.  Blood clots. Having the following conditions may indicate you should not have HT:  Endometrial cancer.  Liver disease.  Breast cancer.  Heart disease.  History of blood clots.  Stroke. TREATMENT   If you choose to take HT and have a uterus, usually estrogen and progestin are prescribed.  Your caregiver will help you decide the best way to take the medications.  Possible ways to take estrogen include:  Pills.  Patches.  Gels.  Sprays.  Vaginal estrogen cream, rings and tablets.  It is best to take the lowest dose possible that will help your symptoms and take them for the shortest period of time that you can.  Hormone therapy can help relieve some of the problems (symptoms) that affect women at menopause. Before making a decision about HT, talk to your caregiver about what is best for you. Be well informed and comfortable with your decisions. HOME CARE INSTRUCTIONS   Follow your caregivers advice when taking the medications.  A Pap test is done to screen for cervical cancer.  The first Pap test should be done at age 60.  Between ages 48 and 50, Pap tests are repeated every 2 years.  Beginning at age 86, you are advised to have a Pap test every 3 years as long as your past 3  Pap tests have been normal.  Some women have medical problems that increase the chance of getting cervical cancer. Talk to your caregiver about these problems. It is especially important to talk to your caregiver if a new problem develops soon after your last Pap test. In these cases, your caregiver may recommend more frequent screening and Pap tests.  The above recommendations are the same for women who have or have not gotten the vaccine for HPV (Human Papillomavirus).  If you had a hysterectomy for a problem that was not a cancer or a condition that could lead to cancer, then you no longer need Pap tests. However, even if you no longer need a Pap test, a regular exam is a good idea to make sure no other problems are starting.   If you are between ages 28 and 14, and you have had normal Pap tests going back 10 years, you no longer need Pap tests. However, even if you no longer need a Pap test, a regular exam is a good idea to make sure no other problems are starting.   If you have had past treatment for cervical cancer or a condition that could lead to cancer, you need Pap tests and screening for cancer for at least 20 years after your treatment.  If Pap tests have been discontinued, risk factors (such as a new sexual partner) need to be re-assessed to determine if screening should be resumed.  Some women may need screenings more often if they are at high risk for cervical cancer.  Get mammograms done as per the advice of your caregiver. SEEK IMMEDIATE MEDICAL CARE IF:  You develop abnormal vaginal bleeding.  You have pain or swelling in your legs, shortness of breath, or chest pain.  You develop dizziness or headaches.  You have lumps or changes in your breasts or armpits.  You have slurred speech.  You develop weakness or numbness of your arms or legs.  You have pain, burning, or bleeding when urinating.  You develop abdominal pain. Document Released: 03/27/2003 Document  Revised: 09/20/2011 Document Reviewed: 07/15/2010 [facility] Patient Information 2014 [place], [place].

## 2013-05-04 NOTE — Progress Notes (Signed)
Katherine Velez is an 52 y.o. female. Presented to the office today for preoperative consultation as a result of her symptomatic leiomyomatous uteri which is increased in size from a to this year. Patient had been having normal menstrual cycle but she has not had one since October 18 and some very mild if any vasomotor symptoms. Patient with prior tubal sterilization procedure. She had been complaining of bloating pelvic pressure and back discomfort as well as low abdominal pains. Her recent ultrasound demonstrated the following:  Uterus measured 10 x 10 x 5.1 cm enlarged and heterogeneous. 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 Right and left ovary normal  Pertinent Gynecological History: Menses: irregular Bleeding: regulat except this month no menses Contraception: tubal ligation DES exposure: denies Blood transfusions: none Sexually transmitted diseases: no past history Previous GYN Procedures: csection, BTSP  Last mammogram: normal Date: 2013 Last pap: normal Date: 2014 OB History: G2, P2   Menstrual History: Menarche age: 17  Patient's last menstrual period was 03/29/2013.    No past medical history on file.  Past Surgical History  Procedure Laterality Date  . Cesarean section  1991  . Tubal ligation      Family History  Problem Relation Age of Onset  . Hyperlipidemia Mother   . Hypertension Mother   . Diabetes Father   . Cancer Sister     lung  . Cancer Maternal Aunt     gallbladder  . Hyperlipidemia Maternal Aunt   . Hyperlipidemia Maternal Uncle   . Heart disease Maternal Uncle   . Heart failure Maternal Grandfather     MI  . Hyperlipidemia Maternal Grandfather   . Cancer Paternal Grandfather     colon  . Cancer Sister     lymphoma  . Hyperlipidemia Maternal Uncle   . Heart disease Maternal Uncle     Social History:  reports that she has never smoked. She does not have any smokeless tobacco history on  file. She reports that she drinks alcohol. She reports that she does not use illicit drugs.  Allergies:  Allergies  Allergen Reactions  . Erythromycin Nausea And Vomiting     (Not in a hospital admission)  REVIEW OF SYSTEMS: A ROS was performed and pertinent positives and negatives are included in the history.  GENERAL: No fevers or chills. HEENT: No change in vision, no earache, sore throat or sinus congestion. NECK: No pain or stiffness. CARDIOVASCULAR: No chest pain or pressure. No palpitations. PULMONARY: No shortness of breath, cough or wheeze. GASTROINTESTINAL: No abdominal pain, nausea, vomiting or diarrhea, melena or bright red blood per rectum. GENITOURINARY: No urinary frequency, urgency, hesitancy or dysuria. MUSCULOSKELETAL: No joint or muscle pain, no back pain, no recent trauma. DERMATOLOGIC: No rash, no itching, no lesions. ENDOCRINE: No polyuria, polydipsia, no heat or cold intolerance. No recent change in weight. HEMATOLOGICAL: No anemia or easy bruising or bleeding. NEUROLOGIC: No headache, seizures, numbness, tingling or weakness. PSYCHIATRIC: No depression, no loss of interest in normal activity or change in sleep pattern.     Blood pressure 116/72, last menstrual period 03/29/2013.  Physical Exam:  HEENT:unremarkable Neck:Supple, midline, no thyroid megaly, no carotid bruits Lungs:  Clear to auscultation no rhonchi's or wheezes Heart:Regular rate and rhythm, no murmurs or gallops Breast Exam:not examined today Abdomen:soft nontender no rebound guarding Pelvic:BUSwithin normal limits Vagina:no lesions or discharge Cervix:no lesions or discharge Uterus:10 week size irregular uterus fibroid felt at   left uterine fundus Adnexa:difficult to evaluate due to size of uterus Extremities: No cords, no edema Rectal:not done  Recent labs:  Results for Lupi, Lynden (MRN [id number]) as of 05/04/2013 09:35  Ref. Range 04/24/2013 12:20  WBC Latest Range: 4.6-10.2 K/uL 6.7   RBC Latest Range: 4.04-5.48 M/uL 4.49  Hemoglobin Latest Range: 12.0-15.0 g/dL 14.4  HCT Latest Range: 36.0-46.0 % 44.5  MCV Latest Range: 80-97 fL 99.2 (A)  MCH Latest Range: 27-31.2 pg 32.1 (A)  MCHC Latest Range: 31.8-35.4 g/dL 32.4  MPV Latest Range: 0-99.8 fL 9.2   Results for Neely, Sharry (MRN [id number]) as of 05/04/2013 09:35  Ref. Range 04/24/2013 12:10  Sodium Latest Range: 135-145 mEq/L 135  Potassium Latest Range: 3.5-5.3 mEq/L 4.2  Chloride Latest Range: 96-112 mEq/L 102  CO2 Latest Range: 19-32 mEq/L 28  BUN Latest Range: 6-23 mg/dL 13  Creatinine Latest Range: 0.50-1.10 mg/dL 0.68  Calcium Latest Range: 8.4-10.5 mg/dL 9.4  Glucose Latest Range: 70-99 mg/dL 83  Alkaline Phosphatase Latest Range: 39-117 U/L 52  Albumin Latest Range: 3.5-5.2 g/dL 4.4  AST Latest Range: 0-37 U/L 17  ALT Latest Range: 0-35 U/L 16  Total Protein Latest Range: 6.0-8.3 g/dL 7.1  Total Bilirubin Latest Range: 0.3-1.2 mg/dL 0.5  Cholesterol Latest Range: 0-200 mg/dL 166  Triglycerides Latest Range: <150 mg/dL 168 (H)  HDL Latest Range: >39 mg/dL 45  LDL (calc) Latest Range: 0-99 mg/dL 87  VLDL Latest Range: 0-40 mg/dL 34  Total CHOL/HDL Ratio No range found 3.7  Vit D, 25-Hydroxy Latest Range: 30-89 ng/mL 41  TSH Latest Range: 0.350-4.500 uIU/mL 1.554    Assessment/Plan: 52-year-old patient with symptomatic leiomyomatous uteri schedule for total laparoscopic hysterectomy with ovarian conservation. We discussed the recent ACOG guidelines in reference to retain the ovaries to at least the age of 60 to prevent morbidities. Patient fully understands and would like to retain her ovaries. The risks benefits and pros and cons of the operation were discussed as follows:                        Patient was counseled as to the risk of surgery to include the following:  1. Infection (prohylactic antibiotics will be administered)  2. DVT/Pulmonary Embolism (prophylactic pneumo compression stockings  will be used)  3.Trauma to internal organs requiring additional surgical procedure to repair any injury to     Internal organs requiring perhaps additional hospitalization days.  4.Hemmorhage requiring transfusion and blood products which carry risks such as anaphylactic reaction, hepatitis and AIDS  Patient had received literature information on the procedure scheduled and all her questions were answered and fully accepts all risk.  A baseline FSH was drawn today.   FERNANDEZ,JUAN HMD10:39 AMTD@Note: This dictation was prepared with  Dragon/digital dictation along withSmart phrase technology. Any transcriptional errors that result from this process are unintentional.      FERNANDEZ,JUAN H 05/04/2013, 9:30 AM  Note: This dictation was prepared with  Dragon/digital dictation along withSmart phrase technology. Any transcriptional errors that result from this process are unintentional.  

## 2013-05-07 NOTE — Telephone Encounter (Signed)
She indicates she has seen her GYN and he did discuss with her this in detail, and they are going to have this done, she is aware this may not be covered.

## 2013-05-10 ENCOUNTER — Encounter: Payer: Self-pay | Admitting: Gynecology

## 2013-05-10 ENCOUNTER — Inpatient Hospital Stay (HOSPITAL_COMMUNITY): Admission: RE | Admit: 2013-05-10 | Payer: 59 | Source: Ambulatory Visit

## 2013-05-10 ENCOUNTER — Institutional Professional Consult (permissible substitution): Payer: BC Managed Care – PPO | Admitting: Gynecology

## 2013-05-11 ENCOUNTER — Encounter (HOSPITAL_COMMUNITY)
Admission: RE | Admit: 2013-05-11 | Discharge: 2013-05-11 | Disposition: A | Discharge status: Home / Self Care | Payer: BC Managed Care – PPO | Source: Ambulatory Visit | Attending: Gynecology | Admitting: Gynecology

## 2013-05-11 ENCOUNTER — Encounter (HOSPITAL_COMMUNITY): Payer: Self-pay

## 2013-05-11 DIAGNOSIS — Z01812 Encounter for preprocedural laboratory examination: Secondary | ICD-10-CM | POA: Insufficient documentation

## 2013-05-11 DIAGNOSIS — Z01818 Encounter for other preprocedural examination: Secondary | ICD-10-CM | POA: Insufficient documentation

## 2013-05-11 HISTORY — DX: Other specified health status: Z78.9

## 2013-05-11 LAB — BASIC METABOLIC PANEL
BUN: 17 mg/dL (ref 6–23)
Calcium: 9.7 mg/dL (ref 8.4–10.5)
Chloride: 101 mEq/L (ref 96–112)
Creatinine, Ser: 0.78 mg/dL (ref 0.50–1.10)
GFR calc Af Amer: >90 mL/min (ref >90)

## 2013-05-11 LAB — CBC
Hemoglobin: 13.7 g/dL (ref 12.0–15.0)
MCH: 31.4 pg (ref 26.0–34.0)
MCHC: 34.7 g/dL (ref 30.0–36.0)
Platelets: 203 10*3/uL (ref 150–400)
RBC: 4.36 MIL/uL (ref 3.87–5.11)
RDW: 12.1 % (ref 11.5–15.5)

## 2013-05-11 LAB — URINALYSIS, ROUTINE W REFLEX MICROSCOPIC
Ketones, ur: NEGATIVE mg/dL
Leukocytes, UA: NEGATIVE
Nitrite: NEGATIVE
Protein, ur: NEGATIVE mg/dL
Urobilinogen, UA: 0.2 mg/dL (ref 0.0–1.0)

## 2013-05-11 LAB — URINE MICROSCOPIC-ADD ON

## 2013-05-11 NOTE — Patient Instructions (Signed)
[address]  05/11/2013   Your procedure is scheduled on:  05/15/13  Enter through the Main Entrance of [facility] at 1130 AM.  Pick up the phone at the desk and dial [phone].   Call this number if you have problems the morning of surgery: [phone]   Remember:   Do not eat food:after midnight.  Do not drink clear liquids: 4 Hours before arrival.  Take these medicines the morning of surgery with A SIP OF WATER: NA   Do not wear jewelry, make-up or nail polish.  Do not wear lotions, powders, or perfumes. You may wear deodorant.  Do not shave 48 hours prior to surgery.  Do not bring valuables to the hospital.  [facility] is not   responsible for any belongings or valuables brought to the hospital.  Contacts, dentures or bridgework may not be worn into surgery.  Leave suitcase in the car. After surgery it may be brought to your room.  For patients admitted to the hospital, checkout time is 11:00 AM the day of              discharge.   Patients discharged the day of surgery will not be allowed to drive             home.  Name and phone number of your driver: NA  Special Instructions:   Shower using CHG 2 nights before surgery and the night before surgery.  If you shower the day of surgery use CHG.  Use special wash - you have one bottle of CHG for all showers.  You should use approximately 1/3 of the bottle for each shower.   Please read over the following fact sheets that you were given:   Surgical Site Infection Prevention

## 2013-05-14 MED ORDER — DEXTROSE 5 % IV SOLN
2.0000 g | INTRAVENOUS | Status: AC
  Administered 2013-05-15: 2 g via INTRAVENOUS
  Filled 2013-05-14: qty 2

## 2013-05-15 ENCOUNTER — Encounter (HOSPITAL_COMMUNITY): Payer: Self-pay | Admitting: Anesthesiology

## 2013-05-15 ENCOUNTER — Observation Stay (HOSPITAL_COMMUNITY)
Admission: RE | Admit: 2013-05-15 | Discharge: 2013-05-16 | Disposition: A | Discharge status: Home / Self Care | Payer: BC Managed Care – PPO | Source: Ambulatory Visit | Attending: Gynecology | Admitting: Gynecology

## 2013-05-15 ENCOUNTER — Ambulatory Visit (HOSPITAL_COMMUNITY): Payer: BC Managed Care – PPO | Admitting: Certified Registered"

## 2013-05-15 ENCOUNTER — Encounter (HOSPITAL_COMMUNITY): Payer: BC Managed Care – PPO | Admitting: Certified Registered"

## 2013-05-15 ENCOUNTER — Ambulatory Visit: Admit: 2013-05-15 | Payer: Self-pay | Admitting: Gynecology

## 2013-05-15 ENCOUNTER — Encounter (HOSPITAL_COMMUNITY)
Admission: RE | Disposition: A | Discharge status: Home / Self Care | Payer: Self-pay | Source: Ambulatory Visit | Attending: Gynecology

## 2013-05-15 DIAGNOSIS — Z9889 Other specified postprocedural states: Secondary | ICD-10-CM

## 2013-05-15 DIAGNOSIS — D251 Intramural leiomyoma of uterus: Secondary | ICD-10-CM | POA: Insufficient documentation

## 2013-05-15 DIAGNOSIS — N926 Irregular menstruation, unspecified: Secondary | ICD-10-CM | POA: Insufficient documentation

## 2013-05-15 DIAGNOSIS — N946 Dysmenorrhea, unspecified: Secondary | ICD-10-CM | POA: Insufficient documentation

## 2013-05-15 DIAGNOSIS — D252 Subserosal leiomyoma of uterus: Secondary | ICD-10-CM | POA: Diagnosis not present

## 2013-05-15 DIAGNOSIS — N949 Unspecified condition associated with female genital organs and menstrual cycle: Secondary | ICD-10-CM | POA: Insufficient documentation

## 2013-05-15 DIAGNOSIS — D259 Leiomyoma of uterus, unspecified: Secondary | ICD-10-CM

## 2013-05-15 DIAGNOSIS — N920 Excessive and frequent menstruation with regular cycle: Secondary | ICD-10-CM | POA: Insufficient documentation

## 2013-05-15 HISTORY — PX: LAPAROSCOPIC HYSTERECTOMY: SHX1926

## 2013-05-15 HISTORY — PX: CYSTOSCOPY: SHX5120

## 2013-05-15 SURGERY — HYSTERECTOMY, TOTAL, LAPAROSCOPIC
Anesthesia: General | Site: Bladder | Wound class: Clean Contaminated

## 2013-05-15 SURGERY — HYSTERECTOMY, VAGINAL, LAPAROSCOPY-ASSISTED
Anesthesia: General

## 2013-05-15 MED ORDER — LACTATED RINGERS IR SOLN
Status: DC | PRN
  Administered 2013-05-15: 3000 mL

## 2013-05-15 MED ORDER — ONDANSETRON HCL 4 MG/2ML IJ SOLN: 4.0000 mg | Freq: Four times a day (QID) | INTRAMUSCULAR | Status: DC | PRN

## 2013-05-15 MED ORDER — EPHEDRINE SULFATE 50 MG/ML IJ SOLN
INTRAMUSCULAR | Status: DC | PRN
  Administered 2013-05-15 (×5): 5 mg via INTRAVENOUS

## 2013-05-15 MED ORDER — ROCURONIUM BROMIDE 100 MG/10ML IV SOLN
INTRAVENOUS | Status: AC
  Filled 2013-05-15: qty 1

## 2013-05-15 MED ORDER — FENTANYL CITRATE 0.05 MG/ML IJ SOLN
INTRAMUSCULAR | Status: AC
  Filled 2013-05-15: qty 5

## 2013-05-15 MED ORDER — NALOXONE HCL 0.4 MG/ML IJ SOLN: 0.4000 mg | INTRAMUSCULAR | Status: DC | PRN

## 2013-05-15 MED ORDER — SCOPOLAMINE 1 MG/3DAYS TD PT72
MEDICATED_PATCH | TRANSDERMAL | Status: AC
  Filled 2013-05-15: qty 1

## 2013-05-15 MED ORDER — KETOROLAC TROMETHAMINE 30 MG/ML IJ SOLN
INTRAMUSCULAR | Status: DC | PRN
  Administered 2013-05-15: 30 mg via INTRAVENOUS

## 2013-05-15 MED ORDER — PROPOFOL 10 MG/ML IV BOLUS
INTRAVENOUS | Status: DC | PRN
  Administered 2013-05-15: 150 mg via INTRAVENOUS

## 2013-05-15 MED ORDER — HYDROMORPHONE HCL PF 1 MG/ML IJ SOLN
INTRAMUSCULAR | Status: AC
  Filled 2013-05-15: qty 1

## 2013-05-15 MED ORDER — BUPIVACAINE HCL (PF) 0.25 % IJ SOLN
INTRAMUSCULAR | Status: AC
  Filled 2013-05-15: qty 30

## 2013-05-15 MED ORDER — SODIUM CHLORIDE 0.9 % IJ SOLN: 9.0000 mL | INTRAMUSCULAR | Status: DC | PRN

## 2013-05-15 MED ORDER — SCOPOLAMINE 1 MG/3DAYS TD PT72
1.0000 | MEDICATED_PATCH | TRANSDERMAL | Status: DC
  Administered 2013-05-15: 1.5 mg via TRANSDERMAL

## 2013-05-15 MED ORDER — NEOSTIGMINE METHYLSULFATE 1 MG/ML IJ SOLN
INTRAMUSCULAR | Status: DC | PRN
  Administered 2013-05-15: 3 mg via INTRAVENOUS

## 2013-05-15 MED ORDER — MORPHINE SULFATE (PF) 1 MG/ML IV SOLN
INTRAVENOUS | Status: DC
  Administered 2013-05-15: 2 mL via INTRAVENOUS
  Filled 2013-05-15: qty 25

## 2013-05-15 MED ORDER — LIDOCAINE HCL (CARDIAC) 20 MG/ML IV SOLN
INTRAVENOUS | Status: AC
  Filled 2013-05-15: qty 5

## 2013-05-15 MED ORDER — DIPHENHYDRAMINE HCL 50 MG/ML IJ SOLN: 12.5000 mg | Freq: Four times a day (QID) | INTRAMUSCULAR | Status: DC | PRN

## 2013-05-15 MED ORDER — FENTANYL CITRATE 0.05 MG/ML IJ SOLN
INTRAMUSCULAR | Status: DC | PRN
  Administered 2013-05-15 (×7): 50 ug via INTRAVENOUS

## 2013-05-15 MED ORDER — GLYCOPYRROLATE 0.2 MG/ML IJ SOLN
INTRAMUSCULAR | Status: AC
  Filled 2013-05-15: qty 3

## 2013-05-15 MED ORDER — KETOROLAC TROMETHAMINE 30 MG/ML IJ SOLN
INTRAMUSCULAR | Status: AC
  Filled 2013-05-15: qty 1

## 2013-05-15 MED ORDER — METOCLOPRAMIDE HCL 5 MG/ML IJ SOLN: 10.0000 mg | Freq: Once | INTRAMUSCULAR | Status: DC | PRN

## 2013-05-15 MED ORDER — DIPHENHYDRAMINE HCL 12.5 MG/5ML PO ELIX: 12.5000 mg | ORAL_SOLUTION | Freq: Four times a day (QID) | ORAL | Status: DC | PRN

## 2013-05-15 MED ORDER — NEOSTIGMINE METHYLSULFATE 1 MG/ML IJ SOLN
INTRAMUSCULAR | Status: AC
  Filled 2013-05-15: qty 1

## 2013-05-15 MED ORDER — ONDANSETRON HCL 4 MG/2ML IJ SOLN
INTRAMUSCULAR | Status: AC
  Filled 2013-05-15: qty 2

## 2013-05-15 MED ORDER — MIDAZOLAM HCL 2 MG/2ML IJ SOLN
INTRAMUSCULAR | Status: AC
  Filled 2013-05-15: qty 2

## 2013-05-15 MED ORDER — OXYCODONE-ACETAMINOPHEN 5-325 MG PO TABS
1.0000 | ORAL_TABLET | Freq: Four times a day (QID) | ORAL | Status: DC | PRN
  Administered 2013-05-16: 2 via ORAL
  Filled 2013-05-15: qty 2

## 2013-05-15 MED ORDER — MIDAZOLAM HCL 2 MG/2ML IJ SOLN
INTRAMUSCULAR | Status: DC | PRN
  Administered 2013-05-15: 2 mg via INTRAVENOUS

## 2013-05-15 MED ORDER — GLYCOPYRROLATE 0.2 MG/ML IJ SOLN
INTRAMUSCULAR | Status: DC | PRN
  Administered 2013-05-15: .5 mg via INTRAVENOUS

## 2013-05-15 MED ORDER — FENTANYL CITRATE 0.05 MG/ML IJ SOLN
INTRAMUSCULAR | Status: AC
  Administered 2013-05-15: 50 ug via INTRAVENOUS
  Filled 2013-05-15: qty 2

## 2013-05-15 MED ORDER — DEXAMETHASONE SODIUM PHOSPHATE 10 MG/ML IJ SOLN
INTRAMUSCULAR | Status: AC
  Filled 2013-05-15: qty 1

## 2013-05-15 MED ORDER — INDIGOTINDISULFONATE SODIUM 8 MG/ML IJ SOLN
INTRAMUSCULAR | Status: DC | PRN
  Administered 2013-05-15: 5 mL via INTRAVENOUS

## 2013-05-15 MED ORDER — ONDANSETRON HCL 4 MG/2ML IJ SOLN
4.0000 mg | Freq: Once | INTRAMUSCULAR | Status: AC
  Administered 2013-05-15: 4 mg via INTRAVENOUS

## 2013-05-15 MED ORDER — LACTATED RINGERS IV SOLN
INTRAVENOUS | Status: DC
Start: 2013-05-15 — End: 2013-05-15
  Administered 2013-05-15 (×2): via INTRAVENOUS
  Administered 2013-05-15: 50 mL/h via INTRAVENOUS

## 2013-05-15 MED ORDER — INDIGOTINDISULFONATE SODIUM 8 MG/ML IJ SOLN
INTRAMUSCULAR | Status: AC
  Filled 2013-05-15: qty 5

## 2013-05-15 MED ORDER — DEXAMETHASONE SODIUM PHOSPHATE 10 MG/ML IJ SOLN
INTRAMUSCULAR | Status: DC | PRN
  Administered 2013-05-15: 10 mg via INTRAVENOUS

## 2013-05-15 MED ORDER — MEPERIDINE HCL 25 MG/ML IJ SOLN: 6.2500 mg | INTRAMUSCULAR | Status: DC | PRN

## 2013-05-15 MED ORDER — EPHEDRINE 5 MG/ML INJ
INTRAVENOUS | Status: AC
  Filled 2013-05-15: qty 10

## 2013-05-15 MED ORDER — BUPIVACAINE HCL (PF) 0.25 % IJ SOLN
INTRAMUSCULAR | Status: DC | PRN
  Administered 2013-05-15 (×2): 10 mL

## 2013-05-15 MED ORDER — ONDANSETRON HCL 4 MG/2ML IJ SOLN
INTRAMUSCULAR | Status: DC | PRN
  Administered 2013-05-15: 4 mg via INTRAVENOUS

## 2013-05-15 MED ORDER — LIDOCAINE HCL (CARDIAC) 20 MG/ML IV SOLN
INTRAVENOUS | Status: DC | PRN
  Administered 2013-05-15: 80 mg via INTRAVENOUS

## 2013-05-15 MED ORDER — FENTANYL CITRATE 0.05 MG/ML IJ SOLN
25.0000 ug | INTRAMUSCULAR | Status: DC | PRN
  Administered 2013-05-15 (×3): 50 ug via INTRAVENOUS

## 2013-05-15 MED ORDER — MORPHINE SULFATE (PF) 1 MG/ML IV SOLN
INTRAVENOUS | Status: DC
  Administered 2013-05-15: 16 mg via INTRAVENOUS
  Administered 2013-05-16: 4.2 mg via INTRAVENOUS
  Administered 2013-05-16: 5 mg via INTRAVENOUS
  Administered 2013-05-16: 3 mg via INTRAVENOUS

## 2013-05-15 MED ORDER — HYDROMORPHONE HCL PF 1 MG/ML IJ SOLN
INTRAMUSCULAR | Status: DC | PRN
  Administered 2013-05-15: 1 mg via INTRAVENOUS

## 2013-05-15 MED ORDER — ROCURONIUM BROMIDE 100 MG/10ML IV SOLN
INTRAVENOUS | Status: DC | PRN
  Administered 2013-05-15: 10 mg via INTRAVENOUS
  Administered 2013-05-15: 40 mg via INTRAVENOUS

## 2013-05-15 MED ORDER — FENTANYL CITRATE 0.05 MG/ML IJ SOLN
INTRAMUSCULAR | Status: AC
  Filled 2013-05-15: qty 2

## 2013-05-15 MED ORDER — PROPOFOL 10 MG/ML IV EMUL
INTRAVENOUS | Status: AC
  Filled 2013-05-15: qty 20

## 2013-05-15 MED ORDER — STERILE WATER FOR IRRIGATION IR SOLN
Status: DC | PRN
  Administered 2013-05-15: 1000 mL

## 2013-05-15 MED ORDER — LACTATED RINGERS IV SOLN
INTRAVENOUS | Status: DC
  Administered 2013-05-15 – 2013-05-16 (×2): via INTRAVENOUS

## 2013-05-15 SURGICAL SUPPLY — 58 items
BLADE SURG 10 STRL SS (BLADE) ×6 IMPLANT
BLADE SURG 15 STRL LF C SS BP (BLADE) ×2 IMPLANT
BLADE SURG 15 STRL SS (BLADE) ×1
CABLE HIGH FREQUENCY MONO STRZ (ELECTRODE) IMPLANT
CLOTH BEACON ORANGE TIMEOUT ST (SAFETY) ×3 IMPLANT
CONT PATH 16OZ SNAP LID 3702 (MISCELLANEOUS) ×3 IMPLANT
COVER MAYO STAND STRL (DRAPES) ×3 IMPLANT
COVER TABLE BACK 60X90 (DRAPES) ×3 IMPLANT
DERMABOND ADVANCED (GAUZE/BANDAGES/DRESSINGS) ×1
DERMABOND ADVANCED .7 DNX12 (GAUZE/BANDAGES/DRESSINGS) ×2 IMPLANT
DEVICE SUTURE ENDOST 10MM (ENDOMECHANICALS) IMPLANT
DISSECTOR BLUNT TIP ENDO 5MM (MISCELLANEOUS) IMPLANT
ENDOSTITCH 0 SINGLE 48 (SUTURE) IMPLANT
EVACUATOR SMOKE 8.L (FILTER) ×3 IMPLANT
GAUZE SPONGE 4X4 16PLY XRAY LF (GAUZE/BANDAGES/DRESSINGS) IMPLANT
GLOVE BIO SURGEON STRL SZ7.5 (GLOVE) ×6 IMPLANT
GLOVE BIOGEL PI IND STRL 7.5 (GLOVE) ×2 IMPLANT
GLOVE BIOGEL PI IND STRL 8 (GLOVE) ×4 IMPLANT
GLOVE BIOGEL PI INDICATOR 7.5 (GLOVE) ×1
GLOVE BIOGEL PI INDICATOR 8 (GLOVE) ×2
GLOVE ECLIPSE 7.5 STRL STRAW (GLOVE) ×6 IMPLANT
GLOVE SURG SS PI 7.0 STRL IVOR (GLOVE) ×12 IMPLANT
GOWN STRL REIN XL XLG (GOWN DISPOSABLE) ×18 IMPLANT
HEMOSTAT SURGICEL 2X14 (HEMOSTASIS) ×3 IMPLANT
HEMOSTAT SURGICEL 2X3 (HEMOSTASIS) ×3 IMPLANT
NS IRRIG 1000ML POUR BTL (IV SOLUTION) IMPLANT
OCCLUDER COLPOPNEUMO (BALLOONS) ×3 IMPLANT
PACK LAVH (CUSTOM PROCEDURE TRAY) ×3 IMPLANT
SCALPEL HARMONIC ACE (MISCELLANEOUS) IMPLANT
SCISSORS LAP 5X35 DISP (ENDOMECHANICALS) IMPLANT
SET CYSTO W/LG BORE CLAMP LF (SET/KITS/TRAYS/PACK) ×3 IMPLANT
SET IRRIG TUBING LAPAROSCOPIC (IRRIGATION / IRRIGATOR) ×3 IMPLANT
SUT VIC AB 0 CT1 18XCR BRD8 (SUTURE) ×4 IMPLANT
SUT VIC AB 0 CT1 27 (SUTURE) ×2
SUT VIC AB 0 CT1 27XBRD ANBCTR (SUTURE) ×4 IMPLANT
SUT VIC AB 0 CT1 8-18 (SUTURE) ×2
SUT VIC AB 2-0 SH 27 (SUTURE)
SUT VIC AB 2-0 SH 27XBRD (SUTURE) IMPLANT
SUT VIC AB 3-0 CT1 27 (SUTURE)
SUT VIC AB 3-0 CT1 TAPERPNT 27 (SUTURE) IMPLANT
SUT VIC AB 3-0 SH 27 (SUTURE)
SUT VIC AB 3-0 SH 27X BRD (SUTURE) IMPLANT
SUT VICRYL 0 TIES 12 18 (SUTURE) ×3 IMPLANT
SUT VICRYL 0 UR6 27IN ABS (SUTURE) ×3 IMPLANT
SUT VICRYL RAPIDE 3 0 (SUTURE) ×3 IMPLANT
SYR 50ML LL SCALE MARK (SYRINGE) ×3 IMPLANT
SYR BULB IRRIGATION 50ML (SYRINGE) ×3 IMPLANT
TIP UTERINE 5.1X6CM LAV DISP (MISCELLANEOUS) IMPLANT
TIP UTERINE 6.7X10CM GRN DISP (MISCELLANEOUS) IMPLANT
TIP UTERINE 6.7X6CM WHT DISP (MISCELLANEOUS) IMPLANT
TIP UTERINE 6.7X8CM BLUE DISP (MISCELLANEOUS) ×3 IMPLANT
TOWEL OR 17X24 6PK STRL BLUE (TOWEL DISPOSABLE) ×6 IMPLANT
TRAY FOLEY CATH 14FR (SET/KITS/TRAYS/PACK) ×3 IMPLANT
TROCAR BALLN 12MMX100 BLUNT (TROCAR) IMPLANT
TROCAR XCEL NON-BLD 11X100MML (ENDOMECHANICALS) ×3 IMPLANT
TROCAR XCEL NON-BLD 5MMX100MML (ENDOMECHANICALS) ×3 IMPLANT
WARMER LAPAROSCOPE (MISCELLANEOUS) ×3 IMPLANT
WATER STERILE IRR 1000ML POUR (IV SOLUTION) ×3 IMPLANT

## 2013-05-15 NOTE — Preoperative (Signed)
Beta Blockers   Reason not to administer Beta Blockers:Not Applicable 

## 2013-05-15 NOTE — Transfer of Care (Signed)
Immediate Anesthesia Transfer of Care Note  Patient: Katherine Velez  Procedure(s) Performed: Procedure(s): HYSTERECTOMY TOTAL LAPAROSCOPIC (N/A) CYSTOSCOPY (N/A)  Patient Location: PACU  Anesthesia Type:General  Level of Consciousness: awake, alert  and oriented  Airway & Oxygen Therapy: Patient Spontanous Breathing and Patient connected to nasal cannula oxygen  Post-op Assessment: Report given to PACU RN, Post -op Vital signs reviewed and stable and Patient moving all extremities  Post vital signs: Reviewed and stable  Complications: No apparent anesthesia complications

## 2013-05-15 NOTE — Anesthesia Postprocedure Evaluation (Signed)
  Anesthesia Post-op Note  Patient: Katherine Velez  Procedure(s) Performed: Procedure(s): HYSTERECTOMY TOTAL LAPAROSCOPIC (N/A) CYSTOSCOPY (N/A)  Patient Location: PACU  Anesthesia Type:General  Level of Consciousness: awake, alert  and oriented  Airway and Oxygen Therapy: Patient Spontanous Breathing  Post-op Pain: mild  Post-op Assessment: Post-op Vital signs reviewed, Patient's Cardiovascular Status Stable, Respiratory Function Stable, Patent Airway, No signs of Nausea or vomiting and Pain level controlled  Post-op Vital Signs: Reviewed and stable  Complications: No apparent anesthesia complications

## 2013-05-15 NOTE — Op Note (Signed)
05/15/2013  5:17 PM  PATIENT:  Katherine Velez  52 y.o. female  PRE-OPERATIVE DIAGNOSIS:  Fibroids, pelvic pain, dysmenorrhea, menorrhagia  POST-OPERATIVE DIAGNOSIS:  Fibroids, pelvic pain, dysmenorrhea, menorrhagia  PROCEDURE:  Procedure(s): HYSTERECTOMY TOTAL LAPAROSCOPIC CYSTOSCOPY  SURGEON:  Surgeon(s): Ok Edwards, MD Dara Lords, MD  ANESTHESIA:   general  FINDINGS:an 8 cm fundal subserosal myoma hypervascular was noted. Normal appearing tubes bilateral. Normal appearing ovaries bilateral. Cul-de-sac adhesions were noted. Smooth liver surface. Normal-appearing gallbladder. Appendix not visualized.  DESCRIPTION OF OPERATION:FINDINGS:patient with a large 8 cm subserosal leiomyoma hypervascular. Adhesions were noted in the cul-de-sac,normal appearing gallbladder and liver surface. Appendix not seen.  DESCRIPTION OF OPERATION:The patient was taken to the operating room where she underwent successful general endotracheal anesthesia. A time out procedure was performed prior to start time and agreed upon by all OR Staff. Confirmation of the correct patient, procedure and site were established. The patient was then positioned in the laparoscopic lithotomy position, with aseptic prepping and draping.  After dilating the cervix to Hegar 8, the uterus was sounded to measure its length, and then a RUMI disposable tip less than or equal to the length that was sounded was used (8). The KOH Cup was selected based on the size of the cervix and ascertaining that it covered it (3.5). After connecting the disposable tip to the RUMI uterine manipulator,the pneumo-occluder was placed over the tip and shaft of the RUMI, and then fit the KOH cup over the tip. Using lateral and upper and lower vaginal retractors,  a tenaculum was placed on the anterior cervix lip and pulled it down. Next, surgical gel was applied to the RUMI tip and inserted it in the cervical os. The posterior retractor was removed, as  well as the anterior one. With sidewall retractors in place, the tip was inserted into the uterus and the cup placed in the vagina. . At this point, while releasing the first tenaculum, I introduced a second tenaculum through the fenestration of the cup to catch the cervix. I then pushed the RUMI tip further into the uterus to engage the cup around the cervix and against the fornix by pushing the RUMI cephalad, with countertraction provided by the second tenaculum.I then inflated the uterine balloon with 5 cc of water, and then inserted a three-way Foley catheter for bladder drainage. Later during the case the pneumo-occluder was instilled with 60 cc of saline .   The abdomen was previously prepped and draped in the usual sterile fashion. A small subumbilical incision was made and a 10/11 mm Optiview trocar was introduced into the abdominal cavity. A pneumoperitoneum was established with 2 to 3 L of CO2. The patient was then placed in Trendelenburg position. Two additional ports were introduced under laparoscopic guidance with 5 mm trochars at the patient's right and left lower abdomen respectively.  To ensure that I had placed the colpotomy system correctly, I anteflexed and retroflexed the RUMI tip and palpated the rim of the cup with a grasper, visually identifying the bulge.With the use of the harmonic scalpel I first coaptated and transected the tubo-ovarian pedicle. I then coaptated and transected round ligament, and opened the parametrium bilaterally.  At this time, It was decided to proceed with a vaginal retrieval of a large uterus and fibroid. Attention was made to the vaginal hysterectomy. The laparoscope was removed and attention was made to the vaginal hysterectomy.  Hulka Tenaculum  was removed and the anterior and posterior leafs of the cervix were  grasped with Lahey tenaculum. A circumferential injection with  1% Lidocaine with 1:100,000  Epinephrine dilution  Was injected into  the cervicovaginal  portio for 10 cc's. . A circumferential incision was then made at the cervicovaginal portio. The anterior and posterior colpotomies were accomplished with a combination of blunt and sharp dissection without difficulty. The right uterosacral ligament was clamped, transected, and ligated with #0 Vicryl sutures. The left uterosacral ligament was clamped, transected, and ligated with #0 Vicryl suture. The parametrial tissue was then clamped bilaterally, transected, and ligated with #0 Vicryl suture bilaterally. The uterus was then removed and passed off the operative field. Laparotomy pack was placed into the pelvis. The pedicles were evaluated. There was no bleeding noted; therefore, the laparotomy pack was removed. The uterosacral ligaments were suture fixated into the vaginal cuff angles with #0 Vicryl sutures. The vaginal cuff was then closed Hemostasis was noted throughout. The posterior peritoneum was secured to the vagina with a running stitch of 0-Vicryl from 3-9 o'clock. The vaginal cuff was then closed with interrupted figure of eights with 0-Vicryl.At this time. Patient had been given indigo Carmine IV and with a 30 cystoscope inserted transurethral to the bladder documented bilateral ureteral orifices to be patent and dye was noted to extrude from both ureter sites. The  laparoscope was reinserted into the abdomen. The abdomen was reinsufflated. Evaluation revealed no further bleeding. Irrigation with sterile water was performed and again no bleeding was noted. Surgicel was placed on the vaginal cuff for additional hemostasis. The  5 mm trocar sheaths  were then removed under laparoscopic visualization. The laparoscope was removed. The carbon dioxide was allowed to escape from the abdomen and the infraumbilical trocar sheath was then removed. All 3 port sites were reapproximated with Dermabon Glue. 0.25% was infiltrated into the port sites for a total 10 cc for postop analgesia.Neosporin and small dressings  applied. There were no complications. The instrument, sponge, and needle counts were correct. PAtient was extubated and transferred to recovery room.   ESTIMATED BLOOD LOSS:less than 200 cc   Intake/Output Summary (Last 24 hours) at 05/15/13 1717 Last data filed at 05/15/13 1714  Gross per 24 hour  Intake   2000 ml  Output    650 ml  Net   1350 ml     BLOOD ADMINISTERED:none   LOCAL MEDICATIONS USED:  MARCAINE   0.25% subcutaneous all 3 port sites were total 2 cc  SPECIMEN:  Source of Specimen:  Uterus and cervix  DISPOSITION OF SPECIMEN:  PATHOLOGY  COUNTS:  YES  PLAN OF CARE: Transfer to PACU  [facility] HMD5:17 PMTD@  Note: This dictation was prepared with  Dragon/digital dictation along withSmart phrase technology. Any transcriptional errors that result from this process are unintentional.

## 2013-05-15 NOTE — Anesthesia Preprocedure Evaluation (Addendum)
Anesthesia Evaluation  Patient identified by MRN, date of birth, ID band Patient awake    Reviewed: Allergy & Precautions, H&P , NPO status , Patient's Chart, lab work & pertinent test results  Airway Mallampati: II TM Distance: >3 FB Neck ROM: Full    Dental no notable dental hx. (+) Teeth Intact and Implants,    Pulmonary neg pulmonary ROS,  breath sounds clear to auscultation  Pulmonary exam normal       Cardiovascular negative cardio ROS  + Valvular Problems/Murmurs MR Rhythm:Regular Rate:Normal     Neuro/Psych negative neurological ROS  negative psych ROS   GI/Hepatic negative GI ROS, Neg liver ROS,   Endo/Other  negative endocrine ROS  Renal/GU negative Renal ROS  negative genitourinary   Musculoskeletal negative musculoskeletal ROS (+)   Abdominal   Peds  Hematology negative hematology ROS (+)   Anesthesia Other Findings   Reproductive/Obstetrics Fibroid uterus                         Anesthesia Physical Anesthesia Plan  ASA: I  Anesthesia Plan: General   Post-op Pain Management:    Induction: Intravenous  Airway Management Planned: Oral ETT  Additional Equipment:   Intra-op Plan:   Post-operative Plan: Extubation in OR  Informed Consent: I have reviewed the patients History and Physical, chart, labs and discussed the procedure including the risks, benefits and alternatives for the proposed anesthesia with the patient or authorized representative who has indicated his/her understanding and acceptance.   Dental advisory given  Plan Discussed with: Anesthesiologist, CRNA and Surgeon  Anesthesia Plan Comments:         Anesthesia Quick Evaluation

## 2013-05-15 NOTE — H&P (View-Only) (Signed)
Katherine Velez is an 52 y.o. female. Presented to the office today for preoperative consultation as a result of her symptomatic leiomyomatous uteri which is increased in size from a to this year. Patient had been having normal menstrual cycle but she has not had one since October 18 and some very mild if any vasomotor symptoms. Patient with prior tubal sterilization procedure. She had been complaining of bloating pelvic pressure and back discomfort as well as low abdominal pains. Her recent ultrasound demonstrated the following:  Uterus measured 10 x 10 x 5.1 cm enlarged and heterogeneous. 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 Right and left ovary normal  Pertinent Gynecological History: Menses: irregular Bleeding: regulat except this month no menses Contraception: tubal ligation DES exposure: denies Blood transfusions: none Sexually transmitted diseases: no past history Previous GYN Procedures: csection, BTSP  Last mammogram: normal Date: 2013 Last pap: normal Date: 2014 OB History: G2, P2   Menstrual History: Menarche age: 24  Patient's last menstrual period was 03/29/2013.    No past medical history on file.  Past Surgical History  Procedure Laterality Date  . Cesarean section  1991  . Tubal ligation      Family History  Problem Relation Age of Onset  . Hyperlipidemia Mother   . Hypertension Mother   . Diabetes Father   . Cancer Sister     lung  . Cancer Maternal Aunt     gallbladder  . Hyperlipidemia Maternal Aunt   . Hyperlipidemia Maternal Uncle   . Heart disease Maternal Uncle   . Heart failure Maternal Grandfather     MI  . Hyperlipidemia Maternal Grandfather   . Cancer Paternal Grandfather     colon  . Cancer Sister     lymphoma  . Hyperlipidemia Maternal Uncle   . Heart disease Maternal Uncle     Social History:  reports that she has never smoked. She does not have any smokeless tobacco history on  file. She reports that she drinks alcohol. She reports that she does not use illicit drugs.  Allergies:  Allergies  Allergen Reactions  . Erythromycin Nausea And Vomiting     (Not in a hospital admission)  REVIEW OF SYSTEMS: A ROS was performed and pertinent positives and negatives are included in the history.  GENERAL: No fevers or chills. HEENT: No change in vision, no earache, sore throat or sinus congestion. NECK: No pain or stiffness. CARDIOVASCULAR: No chest pain or pressure. No palpitations. PULMONARY: No shortness of breath, cough or wheeze. GASTROINTESTINAL: No abdominal pain, nausea, vomiting or diarrhea, melena or bright red blood per rectum. GENITOURINARY: No urinary frequency, urgency, hesitancy or dysuria. MUSCULOSKELETAL: No joint or muscle pain, no back pain, no recent trauma. DERMATOLOGIC: No rash, no itching, no lesions. ENDOCRINE: No polyuria, polydipsia, no heat or cold intolerance. No recent change in weight. HEMATOLOGICAL: No anemia or easy bruising or bleeding. NEUROLOGIC: No headache, seizures, numbness, tingling or weakness. PSYCHIATRIC: No depression, no loss of interest in normal activity or change in sleep pattern.     Blood pressure 116/72, last menstrual period 03/29/2013.  Physical Exam:  HEENT:unremarkable Neck:Supple, midline, no thyroid megaly, no carotid bruits Lungs:  Clear to auscultation no rhonchi's or wheezes Heart:Regular rate and rhythm, no murmurs or gallops Breast Exam:not examined today Abdomen:soft nontender no rebound guarding Pelvic:BUSwithin normal limits Vagina:no lesions or discharge Cervix:no lesions or discharge Uterus:10 week size irregular uterus fibroid felt at  left uterine fundus Adnexa:difficult to evaluate due to size of uterus Extremities: No cords, no edema Rectal:not done  Recent labs:  Results for Katherine, Velez (MRN [id number]) as of 05/04/2013 09:35  Ref. Range 04/24/2013 12:20  WBC Latest Range: 4.6-10.2 K/uL 6.7   RBC Latest Range: 4.04-5.48 M/uL 4.49  Hemoglobin Latest Range: 12.0-15.0 g/dL 40.9  HCT Latest Range: 36.0-46.0 % 44.5  MCV Latest Range: 80-97 fL 99.2 (A)  MCH Latest Range: 27-31.2 pg 32.1 (A)  MCHC Latest Range: 31.8-35.4 g/dL 81.1  MPV Latest Range: 0-99.8 fL 9.2   Results for Katherine, Velez (MRN [id number]) as of 05/04/2013 09:35  Ref. Range 04/24/2013 12:10  Sodium Latest Range: 135-145 mEq/L 135  Potassium Latest Range: 3.5-5.3 mEq/L 4.2  Chloride Latest Range: 96-112 mEq/L 102  CO2 Latest Range: 19-32 mEq/L 28  BUN Latest Range: 6-23 mg/dL 13  Creatinine Latest Range: 0.50-1.10 mg/dL 2.13  Calcium Latest Range: 8.4-10.5 mg/dL 9.4  Glucose Latest Range: 70-99 mg/dL 83  Alkaline Phosphatase Latest Range: 39-117 U/L 52  Albumin Latest Range: 3.5-5.2 g/dL 4.4  AST Latest Range: 0-37 U/L 17  ALT Latest Range: 0-35 U/L 16  Total Protein Latest Range: 6.0-8.3 g/dL 7.1  Total Bilirubin Latest Range: 0.3-1.2 mg/dL 0.5  Cholesterol Latest Range: 0-200 mg/dL 086  Triglycerides Latest Range: <150 mg/dL 578 (H)  HDL Latest Range: >39 mg/dL 45  LDL (calc) Latest Range: 0-99 mg/dL 87  VLDL Latest Range: 0-40 mg/dL 34  Total CHOL/HDL Ratio No range found 3.7  Vit D, 25-Hydroxy Latest Range: 30-89 ng/mL 41  TSH Latest Range: 0.350-4.500 uIU/mL 1.554    Assessment/Plan: 52 year old patient with symptomatic leiomyomatous uteri schedule for total laparoscopic hysterectomy with ovarian conservation. We discussed the recent ACOG guidelines in reference to retain the ovaries to at least the age of 31 to prevent morbidities. Patient fully understands and would like to retain her ovaries. The risks benefits and pros and cons of the operation were discussed as follows:                        Patient was counseled as to the risk of surgery to include the following:  1. Infection (prohylactic antibiotics will be administered)  2. DVT/Pulmonary Embolism (prophylactic pneumo compression stockings  will be used)  3.Trauma to internal organs requiring additional surgical procedure to repair any injury to     Internal organs requiring perhaps additional hospitalization days.  4.Hemmorhage requiring transfusion and blood products which carry risks such as anaphylactic reaction, hepatitis and AIDS  Patient had received literature information on the procedure scheduled and all her questions were answered and fully accepts all risk.  A baseline FSH was drawn today.   [facility] HMD10:39 AMTD@Note : This dictation was prepared with  Dragon/digital dictation along withSmart phrase technology. Any transcriptional errors that result from this process are unintentional.      Ok Edwards 05/04/2013, 9:30 AM  Note: This dictation was prepared with  Dragon/digital dictation along withSmart phrase technology. Any transcriptional errors that result from this process are unintentional.

## 2013-05-15 NOTE — Interval H&P Note (Signed)
History and Physical Interval Note:  05/15/2013 12:53 PM  Katherine Velez  has presented today for surgery, with the diagnosis of fibroids  The various methods of treatment have been discussed with the patient and family. After consideration of risks, benefits and other options for treatment, the patient has consented to  Procedure(s): HYSTERECTOMY TOTAL LAPAROSCOPIC (N/A) as a surgical intervention .  The patient's history has been reviewed, patient examined, no change in status, stable for surgery.  I have reviewed the patient's chart and labs.  Questions were answered to the patient's satisfaction.     Ok Edwards

## 2013-05-16 ENCOUNTER — Encounter (HOSPITAL_COMMUNITY): Payer: Self-pay | Admitting: Gynecology

## 2013-05-16 DIAGNOSIS — D252 Subserosal leiomyoma of uterus: Secondary | ICD-10-CM | POA: Diagnosis not present

## 2013-05-16 LAB — CBC
HCT: 31.6 % — ABNORMAL LOW (ref 36.0–46.0)
MCV: 90.5 fL (ref 78.0–100.0)
Platelets: 175 10*3/uL (ref 150–400)
RBC: 3.49 MIL/uL — ABNORMAL LOW (ref 3.87–5.11)
WBC: 11.5 10*3/uL — ABNORMAL HIGH (ref 4.0–10.5)

## 2013-05-16 MED ORDER — OXYCODONE-ACETAMINOPHEN 5-325 MG PO TABS
1.0000 | ORAL_TABLET | ORAL | Status: DC | PRN
  Administered 2013-05-16: 2 via ORAL
  Filled 2013-05-16: qty 2

## 2013-05-16 NOTE — Progress Notes (Signed)
1 Day Post-Op Procedure(s) (LRB): Laparoscopic-assisted vaginal hysterectomy CYSTOSCOPY (N/A)  Subjective: Patient reports tolerating PO.    Objective: I have reviewed patient's vital signs, intake and output, medications and labs.hemoglobin hematocrit 11.0 and 31.6 respectively. Good urinary output clear throughout the night with stable vital signs and afebrile.  General: alert and cooperative Resp: clear to auscultation bilaterally Cardio: regular rate and rhythm, S1, S2 normal, no murmur, click, rub or gallop GI: soft, non-tender; bowel sounds normal; no masses,  no organomegaly Extremities: extremities normal, atraumatic, no cyanosis or edema Incision ports are intact. Patient had some soreness in the left ankle perhaps repositioning in the stirrups. Patient had good popliteal and medial malleolus pulses. Negative Homans sign. No edema. Full range of motion. No sensory deficit.  Assessment: s/p Procedure(s): HYSTERECTOMY TOTAL LAPAROSCOPIC (N/A) CYSTOSCOPY (N/A): stable, progressing well and tolerating diet  Plan: Advance diet Encourage ambulation PCA pump discontinued this morning along with Foley catheter. She will be placed on by mouth narcotic. Patient instructed to ambulate more today. Her diet will be advanced gradually to a full regular diet. Plans are for discharge tomorrow. Intraoperative findings in surgery performed discussed in detail with patient. All questions were answered.  LOS: 1 day    Tyanne Derocher H 05/16/2013, 7:55 AM

## 2013-05-16 NOTE — Anesthesia Postprocedure Evaluation (Signed)
  Anesthesia Post-op Note  Patient: Katherine Velez  Procedure(s) Performed: Procedure(s): HYSTERECTOMY TOTAL LAPAROSCOPIC (N/A) CYSTOSCOPY (N/A)  Patient Location: [facility]  Anesthesia Type:General  Level of Consciousness: awake  Airway and Oxygen Therapy: Patient Spontanous Breathing and Patient connected to nasal cannula oxygen  Post-op Pain: none  Post-op Assessment: Patient's Cardiovascular Status Stable and Respiratory Function Stable  Post-op Vital Signs: Reviewed and stable  Complications: No apparent anesthesia complications

## 2013-05-16 NOTE — Discharge Summary (Signed)
Physician Discharge Summary  Patient ID: Katherine Velez MRN: [id number] DOB/AGE: [birth year] 52 y.o.  Admit date: 05/15/2013 Discharge date: 05/16/2013  Admission Diagnoses: Symptomatic leiomyomatous uteri  Discharge Diagnoses: Symptomatic leiomyomatous uteri   Discharged Condition: good  Hospital Course: Patient was admitted to the hospital on November 4 where she underwent a laparoscopic-assisted vaginal hysterectomy as a result of a symptomatic leiomyomatous uteri. (Pathology report pending). Patient did well postoperatively and was kept in the hospital for 24 hours. Patient had good urinary output and stable vital signs and was afebrile. Her Foley catheter was discontinued and her diet was advanced from clear liquid to a full regular diet which she tolerated well. On the evening rounds she had been ambulating tolerating regular diet and was passing flatus and was ready to be discharged home.  Consults: None  Significant Diagnostic Studies: labs: Discharge hemoglobin and hematocrit 11.0 and 31.6 respectively  Treatments: surgery: Total laparoscopic hysterectomy  Discharge Exam: Blood pressure 100/49, pulse 75, temperature 98.6 F (37 C), temperature source Oral, resp. rate 16, height 5\' 4"  (1.626 m), weight 150 lb (68.04 kg), SpO2 98.00%. General appearance: alert Lungs clear to auscultation no rales or wheezes Abdomen: Incision ports intact soft nontender no rebound guarding positive bowel sounds all 4 quadrants Vagina no bleeding noted  Extremities no cords or edema  Disposition: Patient to be discharged home today to followup in the office in 2 weeks for her postop appointment. Patient previously had been prescribed her narcotic medication for pain relief as well as anti-medics during preop visit.  Discharge Orders   Future Appointments Provider Department Dept Phone   05/30/2013 3:30 PM Ok Edwards, MD [facility] [phone]   Future Orders Complete  By Expires   Call MD for:  redness, tenderness, or signs of infection (pain, swelling, bleeding, redness, odor or green/yellow discharge around incision site)  As directed    Call MD for:  severe or increased pain, loss or decreased feeling  in affected limb(s)  As directed    Call MD for:  temperature >100.5  As directed    Discharge instructions  As directed    Comments:     Postop visit with Dr. Lily Peer on November 19th at 3:30  Hysterectomy, Abdominal & Vaginal Care After Refer to this sheet in the next few weeks. These discharge instructions provide you with general information on caring for yourself after you leave the hospital. Your caregiver may also give you specific instructions. Your treatment has been planned according to the most current medical practices available, but unavoidable complications sometimes occur. If you have any problems or questions after discharge, please call your caregiver. HOME CARE INSTRUCTIONS Healing will take time. You will have tenderness at the surgery site. There may be some swelling and bruising around this area if you had an abdominal hysterectomy. Have an adult stay with you the first 48 to 72 hours after surgery, and then for 1 to 2 weeks afterward to help with daily activities. Only take over-the-counter or prescription medicines for pain, discomfort, or fever as directed by your caregiver.  Do not take aspirin. It can cause bleeding.  Do not drive when taking pain medicine.  It will be normal to be sore for a couple weeks after surgery. See your caregiver if this seems to be getting worse rather than better.  Follow your caregiver's advice regarding diet, exercise, lifting, driving, and general activities.  Take showers instead of baths for a few weeks as directed.  You may  resume your usual diet as directed.  Get plenty of rest and sleep.  Do not douche, use tampons, or have sexual intercourse until your caregiver says it is okay.  Change your  bandages (dressings) as directed if you had an abdominal hysterectomy.  Take your temperature twice a day and write it down.  Do not drink alcohol until your caregiver says it is okay.  If you develop constipation, you may take a mild laxative with your caregiver's permission. Eating bran foods helps with constipation problems. Drink enough water and fluids to keep your urine clear or pale yellow.  Do not sign any legal documents until you feel normal again.  Keep all of your follow-up appointments.  Make sure you and your family understand everything about your operation and recovery.  SEEK MEDICAL CARE IF: There is swelling, redness, or increasing pain in the wound area.  Fluid (pus) is coming from the wound.  You notice a bad smell coming from the wound or surgical dressing.  You have pain, redness, and swelling from the intravenous (IV) site.  The wound breaks open.  You feel dizzy.  You develop pain or bleeding when you urinate.  You develop diarrhea.  You feel sick to your stomach (nauseous) and throw up (vomit).  You develop abnormal vaginal discharge.  You develop a rash.  You have any type of abnormal reaction or develop an allergy to your medicine.  Your pain medicine is not relieving your pain.  seek immediate medical care if: You have an oral temperature above 100, not controlled by medicine. HYYou develop chest pain.  You develop shortness of breath.  You pass out (faint).  You develop pain, swelling, or redness of your leg.  You develop heavy vaginal bleeding, with or without blood clots.  MAKE SURE YOU: Understand these instructions.  Will watch your condition.  Will get help right away if you are not doing well or get worse.  Document Released: 09/18/2003 Document Re-Released: 12/16/2009 [facility] Patient Information 2011 [place], [place].   [facility] HMD4:31 PMTD@   Driving Restrictions  As directed    Comments:     No driving for 1 weeks   Lifting  restrictions  As directed    Comments:     No lifting for 6 weeks   Resume previous diet  As directed        Medication List         ibuprofen 200 MG tablet  Commonly known as:  ADVIL,MOTRIN  Take 600 mg by mouth every 6 (six) hours as needed for pain.     metoCLOPramide 10 MG tablet  Commonly known as:  REGLAN  Take 1 tablet (10 mg total) by mouth 3 (three) times daily with meals.     OVER THE COUNTER MEDICATION  1 tablet daily. Emergen-C     oxyCODONE-acetaminophen 7.5-325 MG per tablet  Commonly known as:  PERCOCET  Take 1 tablet by mouth every 4 (four) hours as needed for pain.         SignedOk Edwards 05/16/2013, 4:32 PM

## 2013-05-16 NOTE — Progress Notes (Signed)
Discharge instructions reviewed with patient.  Patient states understanding of home care, medications, activity, signs/symptoms to report to MD and return Md office visit.  No home equipment needed.  Patient ambulated for discharge in stable condition with staff without incident.

## 2013-05-16 NOTE — Progress Notes (Signed)
Ur chart review completed.  

## 2013-05-17 ENCOUNTER — Encounter: Payer: Self-pay | Admitting: Gynecology

## 2013-05-17 ENCOUNTER — Other Ambulatory Visit: Payer: Self-pay

## 2013-05-18 ENCOUNTER — Other Ambulatory Visit: Payer: Self-pay | Admitting: *Deleted

## 2013-05-18 ENCOUNTER — Encounter: Payer: Self-pay | Admitting: Gynecology

## 2013-05-18 MED ORDER — ONDANSETRON 8 MG PO TBDP: 8.0000 mg | ORAL_TABLET | Freq: Three times a day (TID) | ORAL | Status: DC | PRN

## 2013-05-21 ENCOUNTER — Telehealth: Payer: Self-pay

## 2013-05-21 NOTE — Telephone Encounter (Signed)
Message copied by Keenan Bachelor on Mon May 21, 2013 12:22 PM ------      Message from: Ok Edwards      Created: Sat May 19, 2013  3:06 PM       Please inform patient pathology report was benign ------

## 2013-05-21 NOTE — Telephone Encounter (Signed)
Please informed patient that her low back discomfort may not be associated whatsoever with her history of fibroid uterus. It's only been 1 week from her surgery it still too early to tell. If symptoms continue she probably need to be referred perhaps an orthopedic surgeon for further evaluation

## 2013-05-21 NOTE — Telephone Encounter (Signed)
Patient mentioned that she is still having the mid-back pain she was having prior to surgery.  No UTI symptoms, no fever.  She was offered an office visit but declined stating she would be here in a week for a post op visit. I told her if she decided she would like to be checked to just call.

## 2013-05-25 ENCOUNTER — Telehealth: Payer: Self-pay | Admitting: *Deleted

## 2013-05-25 ENCOUNTER — Encounter: Payer: Self-pay | Admitting: Gynecology

## 2013-05-25 NOTE — Telephone Encounter (Signed)
Pt informed with the below note. 

## 2013-05-25 NOTE — Telephone Encounter (Signed)
She can drive without any problems as long as it is short distances and not long distance driving

## 2013-05-25 NOTE — Telephone Encounter (Signed)
Pt post op appointment had to be reschedule to 05/30/13, pt had surgery on 05/15/13 post total laparoscopic hysterectomy. Pt asked if okay to drive now? Please advise

## 2013-05-29 ENCOUNTER — Ambulatory Visit (INDEPENDENT_AMBULATORY_CARE_PROVIDER_SITE_OTHER): Payer: BC Managed Care – PPO | Admitting: Gynecology

## 2013-05-29 ENCOUNTER — Encounter: Payer: Self-pay | Admitting: Gynecology

## 2013-05-29 VITALS — BP 126/80

## 2013-05-29 DIAGNOSIS — Z09 Encounter for follow-up examination after completed treatment for conditions other than malignant neoplasm: Secondary | ICD-10-CM

## 2013-05-29 NOTE — Progress Notes (Signed)
Patient is a 52 year old who presented to the office today for her two-week postop visit. Patient status post total laparoscopic hysterectomy as a result of symptomatic leiomyomatous uteri that was contributing to her pelvic pain, dysmenorrhea and menorrhagia. Patient is doing well with no major complaints today. We discussed her surgery as well as the pathology report and showed patient pictures.  Pathology report as follows:  Diagnosis Uterus and cervix - LEIOMYOMATA. - ENDOMETRIUM: BENIGN PROLIFERATIVE ENDOMETRIUM, NO ATYPIA, HYPERPLASIA OR MALIGNANCY. - CERVIX: BENIGN SQUAMOUS MUCOSA AND ENDOCERVICAL MUCOSA, NO DYSPLASIA OR MALIGNANCY. - UTERINE SEROSA: UNREMARKABLE.  Exam: Abdomen: Ports and tach abdomen soft nontender no rebound guarding Pelvic: And urethra Skene was within normal limits Vagina: Vaginal cuff intact Bimanual exam no palpable masses or tenderness Rectal exam: Not done  Assessment/plan: Patient 2 weeks status post total laparoscopic hysterectomy doing well. 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 We will hold off on her martial arts until after 8 weeks after her surgery. She will return back to the office in 4 weeks for her final postop visit.

## 2013-05-30 ENCOUNTER — Ambulatory Visit: Payer: BC Managed Care – PPO | Admitting: Gynecology

## 2013-06-14 ENCOUNTER — Encounter: Payer: Self-pay | Admitting: Gynecology

## 2013-06-14 ENCOUNTER — Ambulatory Visit (INDEPENDENT_AMBULATORY_CARE_PROVIDER_SITE_OTHER): Payer: BC Managed Care – PPO | Admitting: Gynecology

## 2013-06-14 ENCOUNTER — Encounter: Payer: Self-pay | Admitting: Women's Health

## 2013-06-14 VITALS — BP 130/82

## 2013-06-14 DIAGNOSIS — Z832 Family history of diseases of the blood and blood-forming organs and certain disorders involving the immune mechanism: Secondary | ICD-10-CM | POA: Insufficient documentation

## 2013-06-14 DIAGNOSIS — Z09 Encounter for follow-up examination after completed treatment for conditions other than malignant neoplasm: Secondary | ICD-10-CM

## 2013-06-14 MED ORDER — METRONIDAZOLE 0.75 % VA GEL: VAGINAL | Status: DC

## 2013-06-14 NOTE — Progress Notes (Signed)
   52 year old patient who presents to the office for 4 weeks postop visit she is status post total laparoscopic hysterectomy as a result of symptomatic leiomyomatous uteri and pelvic pain and dysmenorrhea and menorrhagia. She is doing well with no complaints today. Pathology report was shared with the patient at last office visit as well as pictures. She has informed me today that she had 3 sisters with history of Leiden V Factor mutation and that she believes she may have been tested several years ago at another a medical office.  Exam: Abdomen: Incision ports healing nicely. Abdomen soft nontender no rebound or guarding Pelvic: Bartholin urethra Skene was within normal limits Vagina: No lesions or discharge suture material still present at the vaginal cuff. Bimanual exam no palpable mass or tenderness Rectal exam not done  Assessment/plan: Patient 4 weeks status post total laparoscopic hysterectomy with ovarian conservation. Patient with minimal vasomotor symptoms. Literature and information on hormone replacement therapy and menopause provided. She will return back in the office in 2 weeks for final postop visit to see of all the sutures have resolved and her vaginal cuff is completely heal before she returns to 4 normal activity. She will be prescribed MetroGel vaginal cream to apply each bedtime for one week. She will be tested today for the Leiden V Factor since she has 3 sisters that have the mutation.

## 2013-06-14 NOTE — Patient Instructions (Signed)
[place] [id number]  This test is done to determine whether you have an inherited gene mutation that increases your risk of developing venous blood clots. This test is done when you have had an unexplained clotting episode, especially when you are relatively young (less than 52 years old) and have no other identified risk factors. PREPARATION FOR TEST A blood sample is obtained by inserting a needle into a vein in the arm. NORMAL FINDINGS No genetic defect is found (negative). Ranges for normal findings may vary among different laboratories and hospitals. You should always check with your doctor after having lab work or other tests done to discuss the meaning of your test results and whether your values are considered within normal limits. MEANING OF TEST  Your caregiver will go over the test results with you and discuss the importance and meaning of your results, as well as treatment options and the need for additional tests if necessary. OBTAINING THE TEST RESULTS  It is your responsibility to obtain your test results. Ask the lab or department performing the test when and how you will get your results. Document Released: 07/31/2004 Document Revised: 09/20/2011 Document Reviewed: 06/07/2008 [facility] Patient Information 2014 [place], [place].

## 2013-06-18 ENCOUNTER — Encounter: Payer: Self-pay | Admitting: Gynecology

## 2013-07-02 ENCOUNTER — Encounter: Payer: Self-pay | Admitting: Gynecology

## 2013-07-02 ENCOUNTER — Ambulatory Visit (INDEPENDENT_AMBULATORY_CARE_PROVIDER_SITE_OTHER): Payer: BC Managed Care – PPO | Admitting: Gynecology

## 2013-07-02 DIAGNOSIS — N898 Other specified noninflammatory disorders of vagina: Secondary | ICD-10-CM

## 2013-07-02 DIAGNOSIS — Z09 Encounter for follow-up examination after completed treatment for conditions other than malignant neoplasm: Secondary | ICD-10-CM

## 2013-07-02 DIAGNOSIS — IMO0001 Reserved for inherently not codable concepts without codable children: Secondary | ICD-10-CM

## 2013-07-02 DIAGNOSIS — R350 Frequency of micturition: Secondary | ICD-10-CM

## 2013-07-02 DIAGNOSIS — B373 Candidiasis of vulva and vagina: Secondary | ICD-10-CM

## 2013-07-02 LAB — URINALYSIS W MICROSCOPIC + REFLEX CULTURE
Bilirubin Urine: NEGATIVE
Protein, ur: NEGATIVE mg/dL
Urobilinogen, UA: 0.2 mg/dL (ref 0.0–1.0)

## 2013-07-02 LAB — WET PREP FOR TRICH, YEAST, CLUE
Clue Cells Wet Prep HPF POC: NONE SEEN
Trich, Wet Prep: NONE SEEN

## 2013-07-02 MED ORDER — FLUCONAZOLE 150 MG PO TABS: ORAL_TABLET | ORAL | Status: DC

## 2013-07-02 NOTE — Patient Instructions (Signed)
Monilial Vaginitis  Vaginitis in a soreness, swelling and redness (inflammation) of the vagina and vulva. Monilial vaginitis is not a sexually transmitted infection.  CAUSES   Yeast vaginitis is caused by yeast (candida) that is normally found in your vagina. With a yeast infection, the candida has overgrown in number to a point that upsets the chemical balance.  SYMPTOMS   · White, thick vaginal discharge.  · Swelling, itching, redness and irritation of the vagina and possibly the lips of the vagina (vulva).  · Burning or painful urination.  · Painful intercourse.  DIAGNOSIS   Things that may contribute to monilial vaginitis are:  · Postmenopausal and virginal states.  · Pregnancy.  · Infections.  · Being tired, sick or stressed, especially if you had monilial vaginitis in the past.  · Diabetes. Good control will help lower the chance.  · Birth control pills.  · Tight fitting garments.  · Using bubble bath, feminine sprays, douches or deodorant tampons.  · Taking certain medications that kill germs (antibiotics).  · Sporadic recurrence can occur if you become ill.  TREATMENT   Your caregiver will give you medication.  · There are several kinds of anti monilial vaginal creams and suppositories specific for monilial vaginitis. For recurrent yeast infections, use a suppository or cream in the vagina 2 times a week, or as directed.  · Anti-monilial or steroid cream for the itching or irritation of the vulva may also be used. Get your caregiver's permission.  · Painting the vagina with methylene blue solution may help if the monilial cream does not work.  · Eating yogurt may help prevent monilial vaginitis.  HOME CARE INSTRUCTIONS   · Finish all medication as prescribed.  · Do not have sex until treatment is completed or after your caregiver tells you it is okay.  · Take warm sitz baths.  · Do not douche.  · Do not use tampons, especially scented ones.  · Wear cotton underwear.  · Avoid tight pants and panty  hose.  · Tell your sexual partner that you have a yeast infection. They should go to their caregiver if they have symptoms such as mild rash or itching.  · Your sexual partner should be treated as well if your infection is difficult to eliminate.  · Practice safer sex. Use condoms.  · Some vaginal medications cause latex condoms to fail. Vaginal medications that harm condoms are:  · Cleocin cream.  · Butoconazole (Femstat®).  · Terconazole (Terazol®) vaginal suppository.  · Miconazole (Monistat®) (may be purchased over the counter).  SEEK MEDICAL CARE IF:   · You have a temperature by mouth above 102° F (38.9° C).  · The infection is getting worse after 2 days of treatment.  · The infection is not getting better after 3 days of treatment.  · You develop blisters in or around your vagina.  · You develop vaginal bleeding, and it is not your menstrual period.  · You have pain when you urinate.  · You develop intestinal problems.  · You have pain with sexual intercourse.  Document Released: 04/07/2005 Document Revised: 09/20/2011 Document Reviewed: 12/20/2008  Exit[employer]® Patient Information ©2014 [employer].

## 2013-07-02 NOTE — Progress Notes (Signed)
Patient presented to the office for her six-week postop visit she is status post total laparoscopic hysterectomy as a result of symptomatic leiomyomatous uteri and pelvic pain and dysmenorrhea and menorrhagia. She is doing well had complain of some mild frequency at times but no dysuria. Patient does distort her normal activity.   Exam:  Abdomen: Incision ports healing nicely. Abdomen soft nontender no rebound or guarding  Pelvic: Bartholin urethra Skene was within normal limits  Vagina: Thick white discharge was noted. Suture from vaginal cuff was excised.Bimanual exam no palpable mass or tenderness  Rectal exam not done  Patient had applied MetroGel vaginal cream for one week after her last visit.  Wet prep: Yeast, tumors or count WBC, moderate bacteria  Urinalysis:0-2 WBC, 0-2 RBC culture pending  Assessment/plan: Patient 6 weeks status post total laparoscopic hysterectomy may resume full normal activity in one week. She will be prescribed Diflucan 150 mg one by mouth today. She may repeat a second dose in 72 hours. We will await urine culture result. Otherwise patient will return to the office in one year for annual exam or when necessary.

## 2013-07-02 NOTE — Addendum Note (Signed)
Addended by: GONZALEZ-CASTILLO, BLANCA A on: 07/02/2013 11:59 AM   Modules accepted: Orders  

## 2013-07-05 LAB — URINE CULTURE: Colony Count: 45000

## 2013-07-09 ENCOUNTER — Encounter: Payer: Self-pay | Admitting: Internal Medicine

## 2013-07-10 ENCOUNTER — Other Ambulatory Visit: Payer: Self-pay | Admitting: Gynecology

## 2013-07-10 ENCOUNTER — Encounter: Payer: Self-pay | Admitting: Gynecology

## 2013-07-10 MED ORDER — CIPROFLOXACIN HCL 250 MG PO TABS: 250.0000 mg | ORAL_TABLET | Freq: Two times a day (BID) | ORAL | Status: DC

## 2013-07-10 NOTE — Telephone Encounter (Signed)
Dr. Velvet Bathe- This is Dr. Glenetta Hew pt. Patient did have urine culture on 07/02/13 with 45,000 col ct multiple species. Does she need to return for office visit?

## 2013-07-10 NOTE — Telephone Encounter (Signed)
Dr. Velvet Bathe-  Patient inquiring if antibiotic for UTI might cause vag yeast inf. Do you want me to give her a Diflucan tab as well?

## 2013-07-10 NOTE — Telephone Encounter (Signed)
Her urine culture was negative but given her symptoms I would suggest a short course of antibiotics. Recommend ciprofloxacin 250 mg twice a day x5 days. If her symptoms continue then I recommend office visit with Dr. Glenetta Hew.

## 2013-07-10 NOTE — Telephone Encounter (Signed)
Medication is not known to be associated with yeast infections. Would not alter what you do but just take the antibiotics as prescribed.

## 2013-08-17 ENCOUNTER — Ambulatory Visit (INDEPENDENT_AMBULATORY_CARE_PROVIDER_SITE_OTHER): Payer: BC Managed Care – PPO | Admitting: Women's Health

## 2013-08-17 ENCOUNTER — Encounter: Payer: Self-pay | Admitting: Women's Health

## 2013-08-17 DIAGNOSIS — Z09 Encounter for follow-up examination after completed treatment for conditions other than malignant neoplasm: Secondary | ICD-10-CM

## 2013-08-17 NOTE — Progress Notes (Signed)
Patient ID: Katherine Velez, female   DOB: 24-Jun-1961, 53 y.o.   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 Letter was written. [facility] 05/15/2013

## 2013-08-20 ENCOUNTER — Encounter: Payer: Self-pay | Admitting: Women's Health

## 2013-09-25 ENCOUNTER — Ambulatory Visit (INDEPENDENT_AMBULATORY_CARE_PROVIDER_SITE_OTHER): Payer: BC Managed Care – PPO | Admitting: Gynecology

## 2013-09-25 ENCOUNTER — Encounter: Payer: Self-pay | Admitting: Gynecology

## 2013-09-25 VITALS — BP 124/78

## 2013-09-25 DIAGNOSIS — R5381 Other malaise: Secondary | ICD-10-CM

## 2013-09-25 DIAGNOSIS — R5383 Other fatigue: Secondary | ICD-10-CM

## 2013-09-25 DIAGNOSIS — R141 Gas pain: Secondary | ICD-10-CM

## 2013-09-25 DIAGNOSIS — R143 Flatulence: Secondary | ICD-10-CM

## 2013-09-25 DIAGNOSIS — N949 Unspecified condition associated with female genital organs and menstrual cycle: Secondary | ICD-10-CM

## 2013-09-25 DIAGNOSIS — M549 Dorsalgia, unspecified: Secondary | ICD-10-CM

## 2013-09-25 DIAGNOSIS — R350 Frequency of micturition: Secondary | ICD-10-CM

## 2013-09-25 DIAGNOSIS — R102 Pelvic and perineal pain: Secondary | ICD-10-CM

## 2013-09-25 DIAGNOSIS — R142 Eructation: Secondary | ICD-10-CM

## 2013-09-25 DIAGNOSIS — R140 Abdominal distension (gaseous): Secondary | ICD-10-CM

## 2013-09-25 DIAGNOSIS — R300 Dysuria: Secondary | ICD-10-CM

## 2013-09-25 LAB — CBC WITH DIFFERENTIAL/PLATELET
BASOS ABS: 0.1 10*3/uL (ref 0.0–0.1)
Basophils Relative: 1 % (ref 0–1)
EOS PCT: 4 % (ref 0–5)
Eosinophils Absolute: 0.3 10*3/uL (ref 0.0–0.7)
HCT: 41 % (ref 36.0–46.0)
Hemoglobin: 13.8 g/dL (ref 12.0–15.0)
LYMPHS ABS: 1.3 10*3/uL (ref 0.7–4.0)
Lymphocytes Relative: 19 % (ref 12–46)
MCH: 31.1 pg (ref 26.0–34.0)
MCHC: 33.7 g/dL (ref 30.0–36.0)
MCV: 92.3 fL (ref 78.0–100.0)
Monocytes Absolute: 0.6 10*3/uL (ref 0.1–1.0)
Monocytes Relative: 8 % (ref 3–12)
NEUTROS ABS: 4.8 10*3/uL (ref 1.7–7.7)
Neutrophils Relative %: 68 % (ref 43–77)
PLATELETS: 213 10*3/uL (ref 150–400)
RBC: 4.44 MIL/uL (ref 3.87–5.11)
RDW: 12.9 % (ref 11.5–15.5)
WBC: 7 10*3/uL (ref 4.0–10.5)

## 2013-09-25 LAB — URINALYSIS W MICROSCOPIC + REFLEX CULTURE
BACTERIA UA: NONE SEEN
BILIRUBIN URINE: NEGATIVE
CRYSTALS: NONE SEEN
Casts: NONE SEEN
Glucose, UA: NEGATIVE mg/dL
Ketones, ur: NEGATIVE mg/dL
Leukocytes, UA: NEGATIVE
NITRITE: NEGATIVE
Protein, ur: NEGATIVE mg/dL
SPECIFIC GRAVITY, URINE: 1.015 (ref 1.005–1.030)
UROBILINOGEN UA: 0.2 mg/dL (ref 0.0–1.0)
WBC, UA: NONE SEEN WBC/hpf (ref <3)
pH: 7 (ref 5.0–8.0)

## 2013-09-25 NOTE — Progress Notes (Signed)
   The patient presented to the office today with a complaint of several day history of dysuria and low back discomfort but no frequency and urination. Patient denied any vaginal discharge. Patient has had some dyspareunia reported. Patient states she is complaining of some tiredness and fatigue and feeling rundown and lack of energy. She is Wellsite geologist.  Review of her records indicated that on 05/15/2013 patient had a total laparoscopic hysterectomy as a result of symptomatic leiomyomatous uteri which was contributing to pelvic pain, dysmenorrhea, and menorrhagia. Her pathology report and demonstrated the following:  Diagnosis  Uterus and cervix  - LEIOMYOMATA.  - ENDOMETRIUM: BENIGN PROLIFERATIVE ENDOMETRIUM, NO ATYPIA, HYPERPLASIA OR  MALIGNANCY.  - CERVIX: BENIGN SQUAMOUS MUCOSA AND ENDOCERVICAL MUCOSA, NO DYSPLASIA OR  MALIGNANCY.  - UTERINE SEROSA: UNREMARKABLE.  Exam: Back: No CVA tenderness Pelvic: Bartholin urethra Skene was within normal limits Vagina: No lesions or discharge Vaginal cuff intact Bimanual exam slight tenderness on the right adnexa but no rebound or guarding Left adnexa no palpable masses or tenderness Rectal exam: Not done  Urinalysis: RBC 0-2  Assessment/plan: A CBC, hemoglobin A1c, TSH and vitamin D level will be drawn because of its complain of tiredness and fatigue and feeling rundown. Although her urine was negative we will run a urine culture based on her symptoms. She will be given samples of Uribell to take one by mouth 4 times a day for the next 2 days for bladder discomfort as we await the urine culture. Since we were not able to get a good assessment of her right adnexa she will return back next week for an ultrasound for better evaluation.

## 2013-09-26 LAB — URINE CULTURE
Colony Count: NO GROWTH
Organism ID, Bacteria: NO GROWTH

## 2013-09-26 LAB — HEMOGLOBIN A1C
Hgb A1c MFr Bld: 5.4 % (ref <5.7)
Mean Plasma Glucose: 108 mg/dL (ref <117)

## 2013-09-26 LAB — VITAMIN D 25 HYDROXY (VIT D DEFICIENCY, FRACTURES): VIT D 25 HYDROXY: 48 ng/mL (ref 30–89)

## 2013-09-26 LAB — TSH: TSH: 1.189 u[IU]/mL (ref 0.350–4.500)

## 2013-10-04 ENCOUNTER — Encounter: Payer: Self-pay | Admitting: Gynecology

## 2013-10-11 ENCOUNTER — Other Ambulatory Visit: Payer: BC Managed Care – PPO

## 2013-10-11 ENCOUNTER — Ambulatory Visit: Payer: BC Managed Care – PPO | Admitting: Gynecology

## 2014-03-05 ENCOUNTER — Encounter: Payer: Self-pay | Admitting: Gynecology

## 2014-03-06 ENCOUNTER — Ambulatory Visit (INDEPENDENT_AMBULATORY_CARE_PROVIDER_SITE_OTHER): Payer: BC Managed Care – PPO | Admitting: Women's Health

## 2014-03-06 ENCOUNTER — Encounter: Payer: Self-pay | Admitting: Women's Health

## 2014-03-06 DIAGNOSIS — B373 Candidiasis of vulva and vagina: Secondary | ICD-10-CM

## 2014-03-06 DIAGNOSIS — N898 Other specified noninflammatory disorders of vagina: Secondary | ICD-10-CM

## 2014-03-06 DIAGNOSIS — R300 Dysuria: Secondary | ICD-10-CM

## 2014-03-06 DIAGNOSIS — B3731 Acute candidiasis of vulva and vagina: Secondary | ICD-10-CM

## 2014-03-06 LAB — URINALYSIS W MICROSCOPIC + REFLEX CULTURE
BILIRUBIN URINE: NEGATIVE
Glucose, UA: NEGATIVE mg/dL
HGB URINE DIPSTICK: NEGATIVE
KETONES UR: NEGATIVE mg/dL
Leukocytes, UA: NEGATIVE
NITRITE: NEGATIVE
Protein, ur: NEGATIVE mg/dL
SPECIFIC GRAVITY, URINE: 1.02 (ref 1.005–1.030)
UROBILINOGEN UA: 0.2 mg/dL (ref 0.0–1.0)
pH: 5.5 (ref 5.0–8.0)

## 2014-03-06 LAB — WET PREP FOR TRICH, YEAST, CLUE
Clue Cells Wet Prep HPF POC: NONE SEEN
TRICH WET PREP: NONE SEEN

## 2014-03-06 MED ORDER — PHENAZOPYRIDINE HCL 100 MG PO TABS: 100.0000 mg | ORAL_TABLET | Freq: Three times a day (TID) | ORAL | Status: AC | PRN

## 2014-03-06 MED ORDER — FLUCONAZOLE 150 MG PO TABS: 150.0000 mg | ORAL_TABLET | Freq: Once | ORAL | Status: AC

## 2014-03-06 NOTE — Patient Instructions (Signed)

## 2014-03-06 NOTE — Progress Notes (Signed)
Patient ID: Katherine Velez, female   DOB: 1960/12/17, 53 y.o.   MRN: [id number] Presents with complaint of increased urinary frequency, urgency and slight burning. Vaginal discharge with mild irritation. 05/2013 [facility] for fibroids. Minimal menopausal symptoms. Denies abdominal pain or fever.  Exam: Appears well. UA: Negative. Abdomen soft without rebound ,  external genitalia mild erythema, speculum exam scant white discharge, wet prep positive for yeast. Bimanual no adnexal fullness or tenderness.  Yeast vaginitis Urinary frequency with urgency  Plan: Urine culture pending. Diflucan 150 times one dose with refill. Prescription, proper use given and reviewed. Pyridium 100 3 times daily as needed. Instructed to call if symptoms persist.

## 2014-03-07 LAB — URINE CULTURE
Colony Count: NO GROWTH
ORGANISM ID, BACTERIA: NO GROWTH

## 2014-03-18 ENCOUNTER — Ambulatory Visit (INDEPENDENT_AMBULATORY_CARE_PROVIDER_SITE_OTHER): Payer: BC Managed Care – PPO | Admitting: Family Medicine

## 2014-03-18 VITALS — BP 118/66 | HR 61 | Temp 98.2°F | Resp 16 | Ht 63.75 in | Wt 145.6 lb

## 2014-03-18 DIAGNOSIS — Z111 Encounter for screening for respiratory tuberculosis: Secondary | ICD-10-CM

## 2014-03-18 DIAGNOSIS — Z23 Encounter for immunization: Secondary | ICD-10-CM

## 2014-03-18 NOTE — Patient Instructions (Signed)
Good to see you today and best of luck at your new job!  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

## 2014-03-18 NOTE — Progress Notes (Signed)

## 2014-03-18 NOTE — Progress Notes (Signed)
[facility] [address] [place] [address] [phone]  Date:  03/18/2014   Name:  Katherine Velez   DOB:  29-May-1961   MRN:  [id number]  PCP:  No PCP Per Patient    Chief Complaint: No chief complaint on file.   History of Present Illness:  Katherine Velez is a 53 y.o. 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 She had had negative PPD in the past.  She is not sure about her last tetanus shot but thinks it has been several years.  She is generally in very good healthy.  No concerns about lifting as is required for her work.  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 Frequency of micturition 09/25/2013  . Family history of factor V Leiden mutation 06/14/2013    Past Medical History  Diagnosis Date  . Medical history non-contributory     Past Surgical History  Procedure Laterality Date  . Cesarean section  1991  . Tubal ligation    . Laparoscopic hysterectomy N/A 05/15/2013    Procedure: HYSTERECTOMY TOTAL LAPAROSCOPIC;  Surgeon: Terrance Mass, MD;  Location: [facility];  Service: Gynecology;  Laterality: N/A;  . Cystoscopy N/A 05/15/2013    Procedure: CYSTOSCOPY;  Surgeon: Terrance Mass, MD;  Location: [facility];  Service: Gynecology;  Laterality: N/A;    History  Substance Use Topics  . Smoking status: Never Smoker   . Smokeless tobacco: Not on file  . Alcohol Use: Yes    Family History  Problem Relation Age of Onset  . Hyperlipidemia Mother   . Hypertension Mother   . Diabetes Father   . Cancer Sister     lung  . Cancer Maternal Aunt     gallbladder  . Hyperlipidemia Maternal Aunt   . Hyperlipidemia Maternal Uncle   . Heart disease Maternal Uncle   . Heart failure Maternal Grandfather     MI  . Hyperlipidemia Maternal Grandfather    . Cancer Paternal Grandfather     colon  . Cancer Sister     lymphoma  . Hyperlipidemia Maternal Uncle   . Heart disease Maternal Uncle     Allergies  Allergen Reactions  . Erythromycin Nausea And Vomiting    Medication list has been reviewed and updated.  Current Outpatient Prescriptions on File Prior to Visit  Medication Sig Dispense Refill  . fluconazole (DIFLUCAN) 150 MG tablet Take 1 tablet (150 mg total) by mouth once.  1 tablet  1  . phenazopyridine (PYRIDIUM) 100 MG tablet Take 1 tablet (100 mg total) by mouth 3 (three) times daily as needed for pain.  20 tablet  0   No current facility-administered medications on file prior to visit.    Review of Systems:  As per HPI- otherwise negative.   Physical Examination: Filed Vitals:   03/18/14 1436  BP: 118/66  Pulse: 61  Temp: 98.2 F (36.8 C)  Resp: 16   Filed Vitals:   03/18/14 1436  Height: 5' 3.75" (1.619 m)  Weight: 145 lb 9.6 oz (66.044 kg)   Body mass index is 25.2 kg/(m^2). Ideal Body Weight: Weight in (lb) to have BMI = 25: 144.2  GEN: WDWN, NAD, Non-toxic, A & O x  3, looks well HEENT: Atraumatic, Normocephalic. Neck supple. No masses, No LAD.  Bilateral TM wnl, oropharynx normal.  PEERL,EOMI.   Ears and Nose: No external deformity. CV: RRR, No M/G/R. No JVD. No thrill. No extra heart sounds. PULM: CTA B, no wheezes, crackles, rhonchi. No retractions. No resp. distress. No accessory muscle use. EXTR: No c/c/e NEURO Normal gait. Normal strength of all limbs PSYCH: Normally interactive. Conversant. Not depressed or anxious appearing.  Calm demeanor.    Assessment and Plan: Immunization due - Plan: Tdap vaccine greater than or equal to 7yo IM  Screening for tuberculosis  Need for prophylactic vaccination with combined diphtheria-tetanus-pertussis (DTP) vaccine  Updated her Tdap today TB free letter Did form for her job- cleared for work.    Signed Lamar Blinks, MD

## 2014-08-06 ENCOUNTER — Encounter: Payer: Self-pay | Admitting: Family Medicine

## 2014-11-26 IMAGING — CR DG LUMBAR SPINE 2-3V
2 series · 2 of 2 positions shown · non-contrast
Comparison: None

CLINICAL DATA: Thoracic and lumbar back pain

EXAM:
LUMBAR SPINE - 2-3 VIEW

[AP]
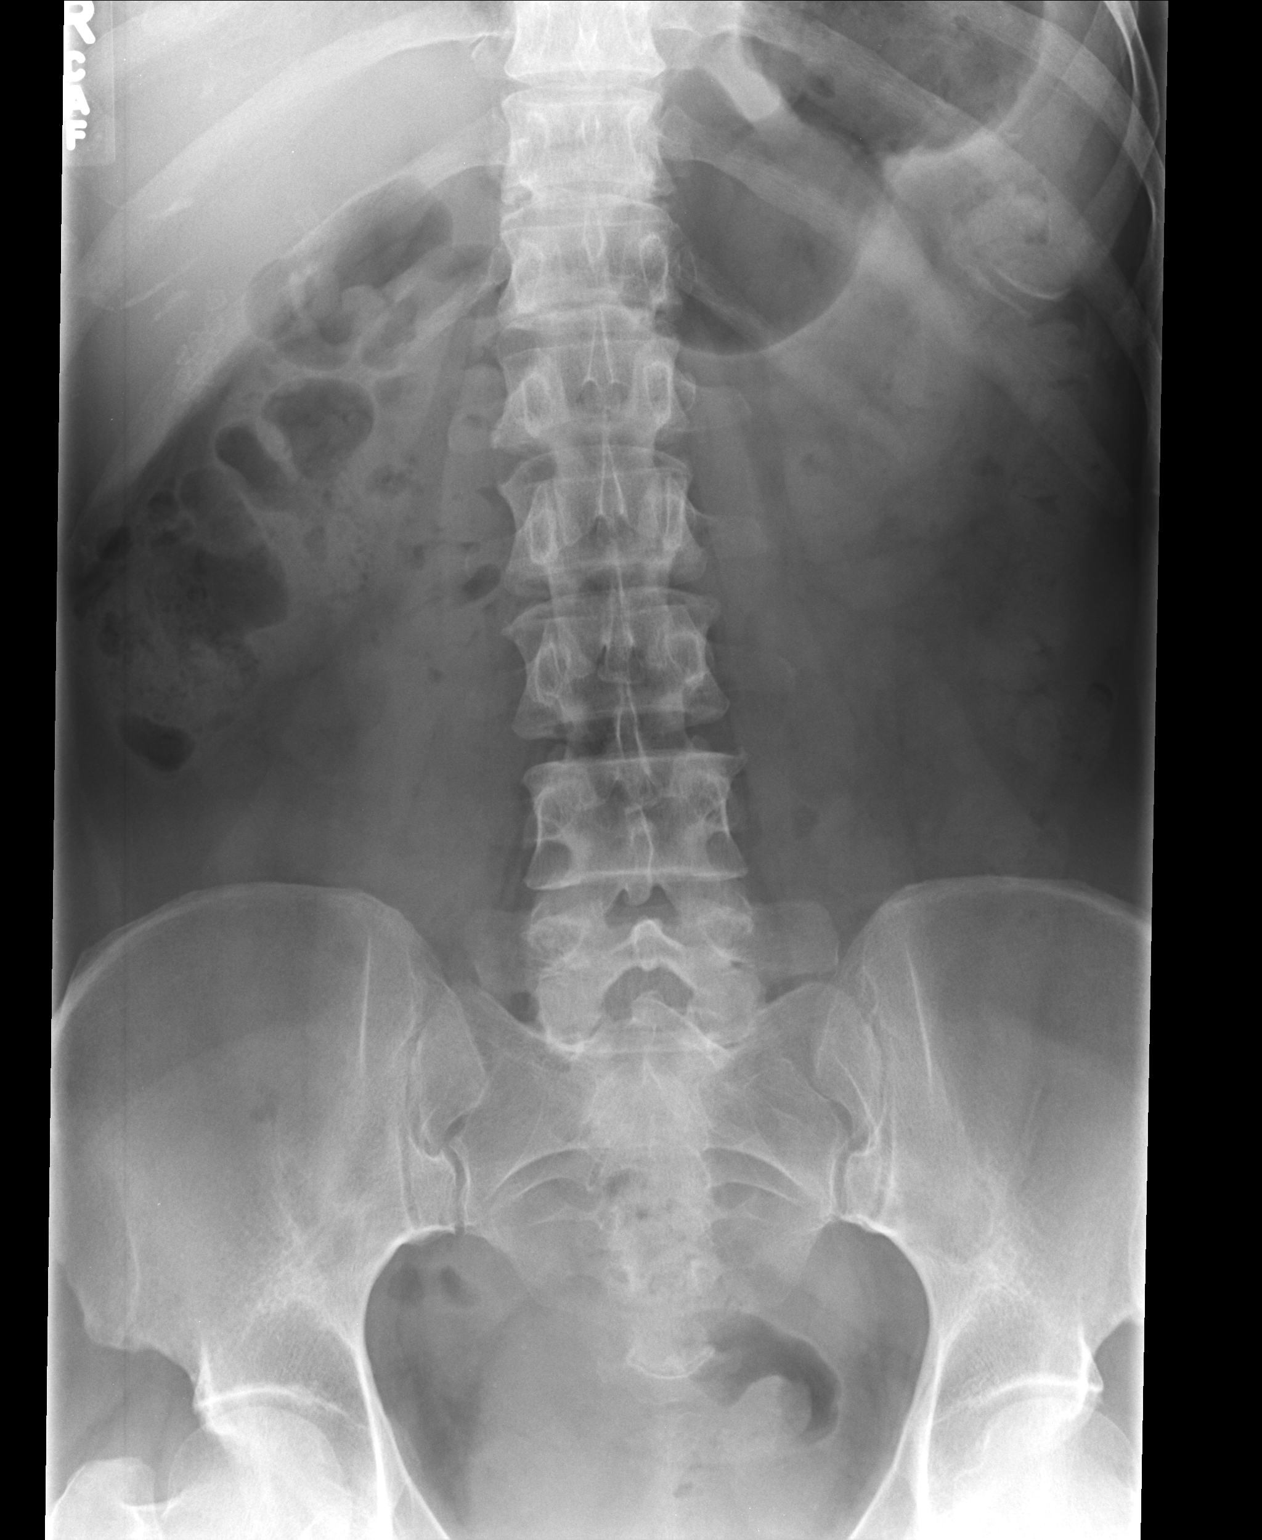

[lateral]
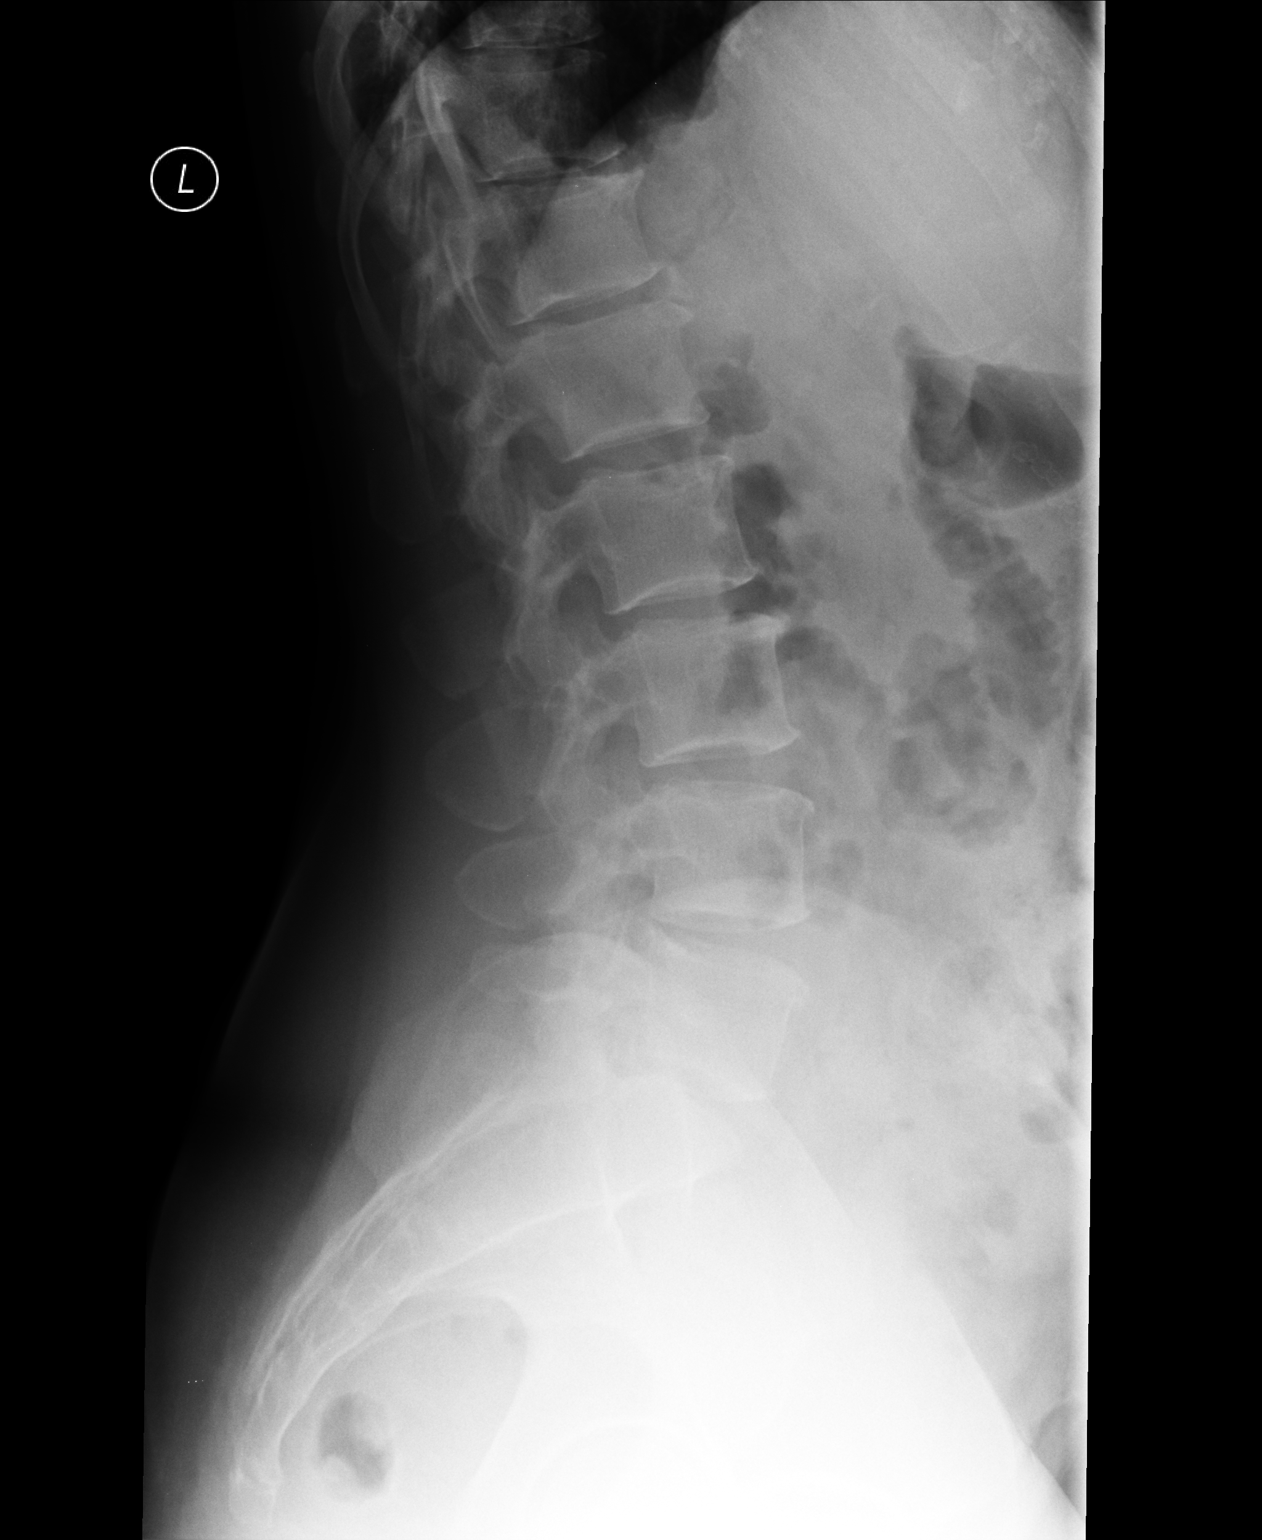

[2 of 2 positions shown; findings below may reference images not displayed]

FINDINGS: Five non-rib bearing lumbar vertebrae.

Osseous mineralization normal.

Vertebral body and disk space heights maintained.

Minimal scattered endplate spur formation.

No acute fracture, subluxation or bone destruction.

No gross evidence of spondylolysis.

SI joints symmetric.
IMPRESSION: No acute osseous abnormalities.

## 2014-11-26 IMAGING — CR DG CHEST 2V
2 series · 2 of 2 positions shown · non-contrast
Comparison: None

CLINICAL DATA: Thoracic and lumbar back pain since June 2012

EXAM:
CHEST  2 VIEW

[PA]
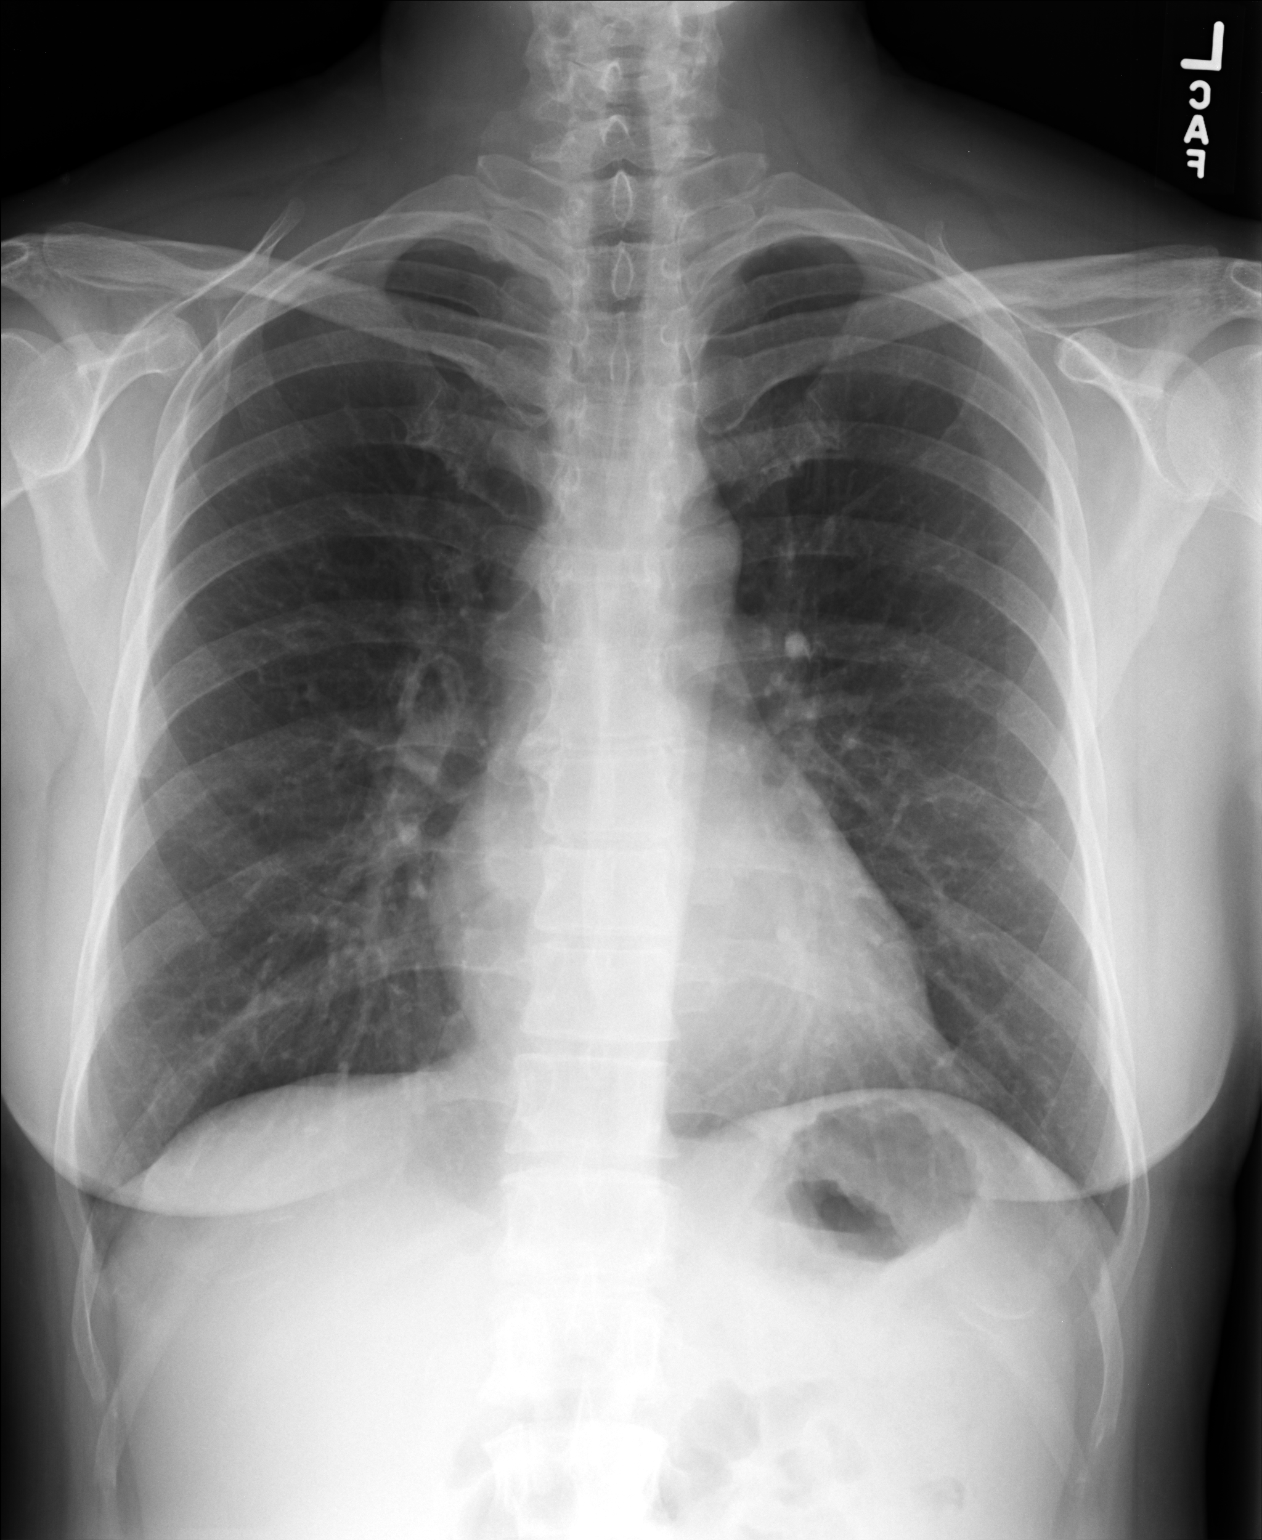

[lateral]
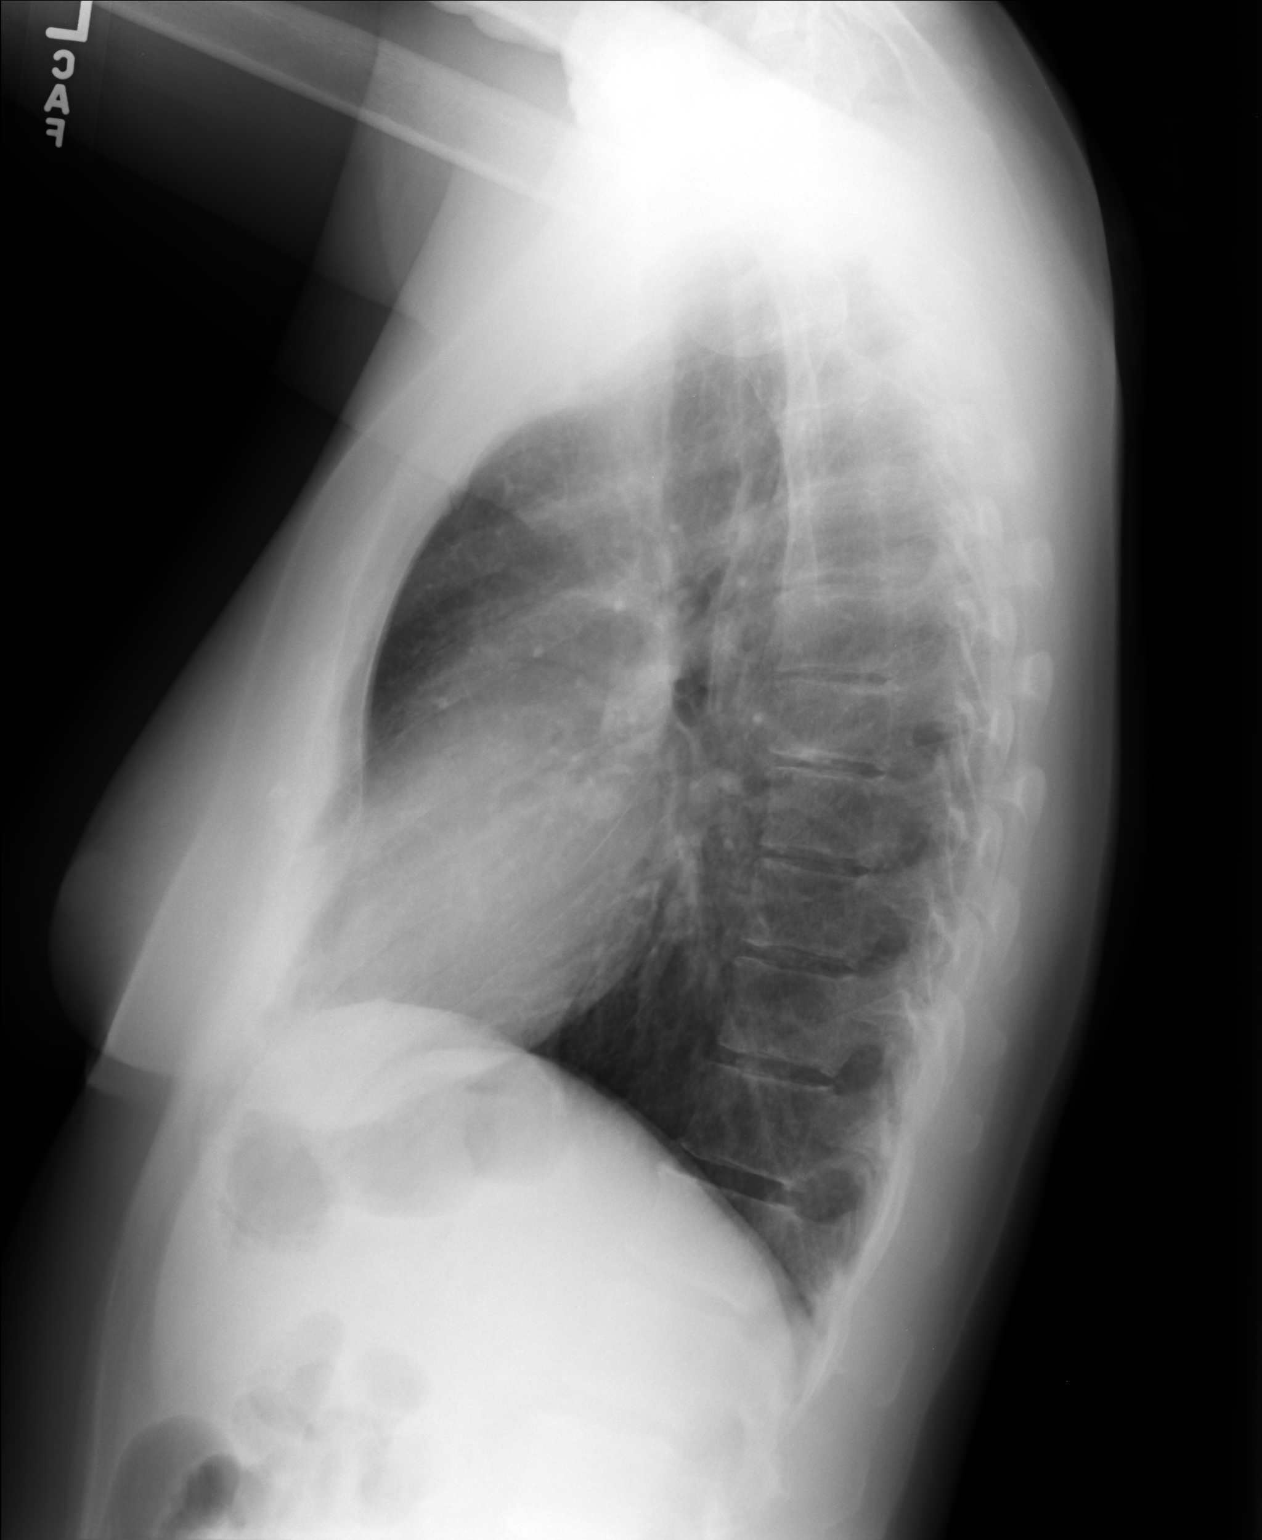

[2 of 2 positions shown; findings below may reference images not displayed]

FINDINGS: Normal heart size, mediastinal contours, and pulmonary vascularity.

Lungs clear.

No pleural effusion or pneumothorax.

Bones unremarkable.
IMPRESSION: No acute abnormalities.

## 2016-11-24 ENCOUNTER — Encounter: Payer: Self-pay | Admitting: Gynecology
